# Patient Record
Sex: Female | Born: 1960 | Race: Black or African American | Hispanic: No | Smoking: Never smoker
Health system: Southern US, Community
[De-identification: ages and names within clinical notes are randomized; demographics above are authoritative.]

## PROBLEM LIST (undated history)

## (undated) DIAGNOSIS — R000 Tachycardia, unspecified: Secondary | ICD-10-CM

## (undated) HISTORY — DX: Tachycardia, unspecified: R00.0

---

## 2011-10-30 ENCOUNTER — Encounter (HOSPITAL_COMMUNITY): Payer: Self-pay | Admitting: *Deleted

## 2011-10-30 ENCOUNTER — Emergency Department (HOSPITAL_COMMUNITY)
Admission: EM | Admit: 2011-10-30 | Discharge: 2011-10-30 | Disposition: A | Discharge status: Home / Self Care | Payer: BC Managed Care – PPO | Source: Home / Self Care

## 2011-10-30 DIAGNOSIS — N390 Urinary tract infection, site not specified: Secondary | ICD-10-CM

## 2011-10-30 LAB — POCT URINALYSIS DIP (DEVICE)
Bilirubin Urine: NEGATIVE
Glucose, UA: NEGATIVE mg/dL
Nitrite: NEGATIVE

## 2011-10-30 MED ORDER — CIPROFLOXACIN HCL 500 MG PO TABS: 500.0000 mg | ORAL_TABLET | Freq: Two times a day (BID) | ORAL | Status: AC

## 2011-10-30 NOTE — ED Notes (Signed)
Pt  Has  Symptoms  Of  Frequency   Burning    And  Low  Back  Pain        X   1   Week    Reports  Some  Chills  As  Well        Pt  Is  Awake  As  Well as  Alert and  oreinted  Pleasant

## 2011-10-30 NOTE — ED Provider Notes (Signed)
History     CSN: [id number]  Arrival date & time 10/30/11  1440   First MD Initiated Contact with Patient 10/30/11 1459      Chief Complaint  Patient presents with  . Urinary Tract Infection    (Consider location/radiation/quality/duration/timing/severity/associated sxs/prior treatment) HPI  Patient is 51 year old female who presents to urgent care clinic with main concern of 3 days history of dysuria, urinary frequency and urgency, suprapubic area pain. In addition patient reports subjective fevers and chills, reports similar episode approximately one year ago when she was diagnosed with urinary tract infection. Patient reports having menstrual periods at this time and has history of bilateral tubal ligation 10 years ago. Patient denies other abdominal concerns, no blood in urine or stool, denies chest pain or shortness of breath. She describes dysuria and is uncomfortable, 7/10 in severity, no specific aggravating or alleviating factors and as mentioned already associated with subjective fevers and chills.  No past medical history on file.  No past surgical history on file.  No family history on file.  History  Substance Use Topics  . Smoking status: Not on file  . Smokeless tobacco: Not on file  . Alcohol Use: Not on file    OB History    No data available      Review of Systems  Review of Systems  Constitutional: Positive for fever, chills. Negative for diaphoresis, activity change, appetite change and fatigue.  HENT: Negative for ear pain, nosebleeds, congestion, facial swelling, rhinorrhea, neck pain, neck stiffness and ear discharge.   Eyes: Negative for pain, discharge, redness, itching and visual disturbance.  Respiratory: Negative for cough, choking, chest tightness, shortness of breath, wheezing and stridor.   Cardiovascular: Negative for chest pain, palpitations and leg swelling.  Gastrointestinal: Negative for abdominal distention.  Genitourinary: Positive  for dysuria, urgency, frequency, decreased urine volume. Musculoskeletal: Negative for back pain, joint swelling, arthralgias and gait problem.  Neurological: Negative for dizziness, tremors, seizures, syncope, facial asymmetry, speech difficulty, weakness, light-headedness, numbness and headaches.  Hematological: Negative for adenopathy. Does not bruise/bleed easily.  Psychiatric/Behavioral: Negative for hallucinations, behavioral problems, confusion, dysphoric mood, decreased concentration and agitation.    Allergies  Review of patient's allergies indicates not on file.  Home Medications  No current outpatient prescriptions on file.  BP 153/79  Pulse 80  Temp 98.7 F (37.1 C) (Oral)  Resp 16  SpO2 98%  Physical Exam  Physical Exam  Constitutional: Appears well-developed and well-nourished. No distress.  Cardiovascular: Normal rate, regular rhythm, normal heart sounds and intact distal pulses.  Exam reveals no gallop and no friction rub. No murmur heard. Pulmonary/Chest: Effort normal and breath sounds normal. No stridor. No respiratory distress. No wheezes, no rales.  Abdominal: Soft. Bowel sounds are normal. No distension and no mass, no tenderness. No rebound and no guarding.  Lymphadenopathy: No lymphadenopathy noted, cervical, inguinal. Skin: Skin is warm and dry. No rash noted. Not diaphoretic. No erythema. No pallor.   ED Course  Procedures (including critical care time)  Labs Reviewed - No data to display No results found.   Urinary tract infection - Patient symptoms consistent with urinary tract infection - I have emphasized importance of hygiene as well as a drinking plenty of fluids, discuss cranberry juice - I have also prescribed course of antibiotic for 5 days - Patient encouraged to see primary care physician if symptoms get worse - Urinalysis significant for small leukocytes   MDM  Urinary tract infection - will need course of  antibiotic        Dorothea Ogle, MD 10/30/11 1700

## 2011-10-30 NOTE — Discharge Instructions (Signed)

## 2012-08-01 ENCOUNTER — Emergency Department (HOSPITAL_COMMUNITY)
Admission: EM | Admit: 2012-08-01 | Discharge: 2012-08-01 | Disposition: A | Discharge status: Home / Self Care | Payer: BC Managed Care – PPO | Source: Home / Self Care | Attending: Emergency Medicine | Admitting: Emergency Medicine

## 2012-08-01 ENCOUNTER — Encounter (HOSPITAL_COMMUNITY): Payer: Self-pay | Admitting: *Deleted

## 2012-08-01 DIAGNOSIS — W57XXXA Bitten or stung by nonvenomous insect and other nonvenomous arthropods, initial encounter: Secondary | ICD-10-CM

## 2012-08-01 DIAGNOSIS — T148 Other injury of unspecified body region: Secondary | ICD-10-CM

## 2012-08-01 MED ORDER — TRIAMCINOLONE ACETONIDE 0.1 % EX CREA: TOPICAL_CREAM | Freq: Two times a day (BID) | CUTANEOUS | Status: AC

## 2012-08-01 MED ORDER — HYDROXYZINE HCL 25 MG PO TABS: 25.0000 mg | ORAL_TABLET | Freq: Four times a day (QID) | ORAL | Status: DC

## 2012-08-01 NOTE — ED Notes (Signed)
Pt reports attending conference last week and bug bugs were in the hotel that she stayed at - red, raised, itching areas to right arm, neck and back since last wed.

## 2012-08-01 NOTE — ED Provider Notes (Signed)
isHistory     CSN: [id number]  Arrival date & time 08/01/12  1125   First MD Initiated Contact with Patient 08/01/12 1214      Chief Complaint  Patient presents with  . Rash    (Consider location/radiation/quality/duration/timing/severity/associated sxs/prior treatment) HPI Comments: Patient presents care to this complaining of ongoing itchiness and a rash mainly concentrated on her right upper extremity, right neck and upper back, with moderate itchiness this started last week when she was attending a conference in a hotel in [place] where she stayed at. Patient developed a red raised and itchy rash since last Wednesday. Patient has not had any difficulty swallowing breathing or facial swelling. No constitutional symptoms such as fevers, generalized malaise arthralgias myalgias, nausea or vomiting. Patient took pictures of the actual " bedbugs", and informed the hotel administration at that point.  Patient is a 52 y.o. female presenting with rash. The history is provided by the patient.  Rash Location:  Torso and shoulder/arm Shoulder/arm rash location:  R arm Quality: itchiness, redness and swelling   Quality: not blistering, not bruising, not draining and not scaling   Onset quality:  Sudden Timing:  Constant Progression:  Worsening Context: not animal contact   Relieved by:  Nothing Ineffective treatments:  None tried Associated symptoms: induration   Associated symptoms: no fatigue, no fever, no headaches, no joint pain, no periorbital edema, no throat swelling and not vomiting     History reviewed. No pertinent past medical history.  History reviewed. No pertinent past surgical history.  Family History  Problem Relation Age of Onset  . Family history unknown: Yes    History  Substance Use Topics  . Smoking status: Never Smoker   . Smokeless tobacco: Not on file  . Alcohol Use: No    OB History   Grav Para Term Preterm Abortions TAB SAB Ect Mult Living                    Review of Systems  Constitutional: Negative for fever, chills, diaphoresis, activity change, appetite change and fatigue.  HENT: Negative for trouble swallowing.   Gastrointestinal: Negative for vomiting.  Musculoskeletal: Negative for arthralgias.  Skin: Positive for color change and rash. Negative for pallor and wound.  Neurological: Negative for headaches.    Allergies  Sodium benzoate and Sulfa antibiotics  Home Medications   Current Outpatient Rx  Name  Route  Sig  Dispense  Refill  . hydrOXYzine (ATARAX/VISTARIL) 25 MG tablet   Oral   Take 1 tablet (25 mg total) by mouth every 6 (six) hours.   12 tablet   0   . triamcinolone cream (KENALOG) 0.1 %   Topical   Apply topically 2 (two) times daily. APPLY TO AFFECTED AREAS   30 g   0     BP 141/52  Pulse 74  Temp(Src) 97.9 F (36.6 C) (Oral)  Resp 16  SpO2 100%  LMP 07/04/2012  Physical Exam  Nursing note and vitals reviewed. Constitutional: She is oriented to person, place, and time. She appears well-developed and well-nourished.  Non-toxic appearance. She does not have a sickly appearance. She does not appear ill. No distress.  HENT:  Head: Normocephalic.  Neurological: She is alert and oriented to person, place, and time.  Skin: Skin is warm. Rash noted. No abrasion, no bruising, no burn, no ecchymosis, no laceration, no lesion and no petechiae noted. Rash is papular and maculopapular. There is erythema.  ED Course  Procedures (including critical care time)  Labs Reviewed - No data to display No results found.   1. Bed bug bite      Patient verbally consented for patient to be taken for medical documentation purposes. MDM   Symptoms and skin exam consistent with bed bug bites. Prescribe patient may be able to see local steroid as well as prescribe hydroxyzine for pruritus-      Jimmie Molly, MD 08/01/12 1542

## 2014-01-26 ENCOUNTER — Emergency Department (HOSPITAL_COMMUNITY)
Admission: EM | Admit: 2014-01-26 | Discharge: 2014-01-26 | Disposition: A | Discharge status: Home / Self Care | Payer: BC Managed Care – PPO | Attending: Emergency Medicine | Admitting: Emergency Medicine

## 2014-01-26 ENCOUNTER — Emergency Department (HOSPITAL_COMMUNITY): Payer: BC Managed Care – PPO

## 2014-01-26 ENCOUNTER — Encounter (HOSPITAL_COMMUNITY): Payer: Self-pay | Admitting: Emergency Medicine

## 2014-01-26 DIAGNOSIS — R079 Chest pain, unspecified: Secondary | ICD-10-CM | POA: Diagnosis not present

## 2014-01-26 DIAGNOSIS — R0789 Other chest pain: Secondary | ICD-10-CM

## 2014-01-26 LAB — I-STAT TROPONIN, ED
TROPONIN I, POC: 0 ng/mL (ref 0.00–0.08)
Troponin i, poc: 0 ng/mL (ref 0.00–0.08)

## 2014-01-26 LAB — CBC
HCT: 42.2 % (ref 36.0–46.0)
Hemoglobin: 13.9 g/dL (ref 12.0–15.0)
MCH: 28.7 pg (ref 26.0–34.0)
MCHC: 32.9 g/dL (ref 30.0–36.0)
MCV: 87 fL (ref 78.0–100.0)
Platelets: 277 10*3/uL (ref 150–400)
RBC: 4.85 MIL/uL (ref 3.87–5.11)
RDW: 13.3 % (ref 11.5–15.5)
WBC: 7.4 10*3/uL (ref 4.0–10.5)

## 2014-01-26 LAB — BASIC METABOLIC PANEL
Anion gap: 10 (ref 5–15)
BUN: 10 mg/dL (ref 6–23)
CO2: 25 mEq/L (ref 19–32)
Calcium: 9.5 mg/dL (ref 8.4–10.5)
Chloride: 103 mEq/L (ref 96–112)
Creatinine, Ser: 0.69 mg/dL (ref 0.50–1.10)
GFR calc Af Amer: >90 mL/min (ref >90)
GFR calc non Af Amer: >90 mL/min (ref >90)
Glucose, Bld: 100 mg/dL — ABNORMAL HIGH (ref 70–99)
Potassium: 4.3 mEq/L (ref 3.7–5.3)
Sodium: 138 mEq/L (ref 137–147)

## 2014-01-26 MED ORDER — ASPIRIN 81 MG PO CHEW
324.0000 mg | CHEWABLE_TABLET | Freq: Once | ORAL | Status: AC
  Administered 2014-01-26: 324 mg via ORAL
  Filled 2014-01-26: qty 4

## 2014-01-26 MED ORDER — NITROGLYCERIN 0.4 MG SL SUBL
0.4000 mg | SUBLINGUAL_TABLET | SUBLINGUAL | Status: DC | PRN
  Administered 2014-01-26 (×2): 0.4 mg via SUBLINGUAL
  Filled 2014-01-26 (×2): qty 1

## 2014-01-26 NOTE — ED Provider Notes (Signed)
CSN: [id number]     Arrival date & time 01/26/14  1117 History   First MD Initiated Contact with Patient 01/26/14 1617     Chief Complaint  Patient presents with  . Chest Pain     (Consider location/radiation/quality/duration/timing/severity/associated sxs/prior Treatment) HPI Comments: Patient is an otherwise healthy 53 yo F presenting to the ED for one week of intermittent episodes of central chest pressure with no other associated symptoms. Patient states she has had constant CP since 9AM this morning. No alleviating or aggravating factors. Denies any history of similar chest pain. Denies any shortness of breath, cough, unilateral leg swelling, fever, chills, abdominal pain. Denies any recent surgeries, travel, immobilization. No history of PE or DVT in the past. No early familial cardiac history. No history of echocardiogram, stress test or cardiac catheterization  Patient is a 53 y.o. female presenting with chest pain.  Chest Pain   History reviewed. No pertinent past medical history. History reviewed. No pertinent past surgical history. No family history on file. History  Substance Use Topics  . Smoking status: Never Smoker   . Smokeless tobacco: Not on file  . Alcohol Use: No   OB History   Grav Para Term Preterm Abortions TAB SAB Ect Mult Living                 Review of Systems  Cardiovascular: Positive for chest pain.  All other systems reviewed and are negative.     Allergies  Sodium benzoate and Sulfa antibiotics  Home Medications   Prior to Admission medications   Not on File   BP 115/65  Pulse 71  Temp(Src) 98.3 F (36.8 C) (Oral)  Resp 20  SpO2 99%  LMP 01/12/2014 Physical Exam  Nursing note and vitals reviewed. Constitutional: She is oriented to person, place, and time. She appears well-developed and well-nourished. No distress.  HENT:  Head: Normocephalic and atraumatic.  Right Ear: External ear normal.  Left Ear: External ear normal.  Nose:  Nose normal.  Mouth/Throat: Oropharynx is clear and moist.  Eyes: Conjunctivae are normal.  Neck: Normal range of motion. Neck supple.  Cardiovascular: Normal rate, regular rhythm and normal heart sounds.   Pulmonary/Chest: Effort normal and breath sounds normal. No respiratory distress. She exhibits no tenderness.  Abdominal: Soft. Bowel sounds are normal. There is no tenderness.  Musculoskeletal: Normal range of motion. She exhibits no edema.  Neurological: She is alert and oriented to person, place, and time.  Skin: Skin is warm and dry. She is not diaphoretic.  Psychiatric: She has a normal mood and affect.    ED Course  Procedures (including critical care time) Medications  nitroGLYCERIN (NITROSTAT) SL tablet 0.4 mg (0.4 mg Sublingual Given 01/26/14 1716)  aspirin chewable tablet 324 mg (324 mg Oral Given 01/26/14 1703)    Labs Review Labs Reviewed  BASIC METABOLIC PANEL - Abnormal; Notable for the following:    Glucose, Bld 100 (*)    All other components within normal limits  CBC  I-STAT TROPOININ, ED  Rosezena Sensor, ED    Imaging Review Dg Chest 2 View  01/26/2014   CLINICAL DATA:  Mid chest pressure today.  EXAM: CHEST  2 VIEW  COMPARISON:  None.  FINDINGS: The heart size is at the upper limits of normal. This may be exacerbated by a relatively shallow inspiratory effort. The lungs are clear. There is no pleural effusion or pneumothorax. The osseous structures appear normal. Telemetry leads overlie the chest.  IMPRESSION: Borderline heart  size.  No acute cardiopulmonary process.   Electronically Signed   By: Roxy Horseman M.D.   On: 01/26/2014 16:45     EKG Interpretation None      5:46 PM Patient pain free.  Heart Score is 2.  MDM   Final diagnoses:  Chest pain of unknown etiology    Filed Vitals:   01/26/14 1815  BP: 115/65  Pulse: 71  Temp:   Resp: 20   Afebrile, NAD, non-toxic appearing, AAOx4.   Patient is to be discharged with recommendation  to follow up with PCP in regards to today's hospital visit. Chest pain is not likely of cardiac or pulmonary etiology d/t presentation, low clinical suspicion for PE, VSS, no tracheal deviation, no JVD or new murmur, RRR, breath sounds equal bilaterally, EKG without acute abnormalities, negative delta troponin, and negative CXR. Heart score is 2. Pt has been advised to follow up with cardiology as an outpatient and return to the ED is CP becomes exertional, associated with diaphoresis or nausea, radiates to left jaw/arm, worsens or becomes concerning in any way. Pt appears reliable for follow up and is agreeable to discharge.   Case has been discussed with Dr. Juleen China who agrees with the above plan to discharge.      Jeannetta Ellis, PA-C 01/26/14 2202

## 2014-01-26 NOTE — Discharge Instructions (Signed)
Please follow up with your primary care physician in 1-2 days. If you do not have one please call the [facility] number listed above. Please follow up with a cardiology group to schedule a follow up appointment.  Please read all discharge instructions and return precautions.    Chest Pain (Nonspecific) It is often hard to give a specific diagnosis for the cause of chest pain. There is always a chance that your pain could be related to something serious, such as a heart attack or a blood clot in the lungs. You need to follow up with your health care provider for further evaluation. CAUSES   Heartburn.  Pneumonia or bronchitis.  Anxiety or stress.  Inflammation around your heart (pericarditis) or lung (pleuritis or pleurisy).  A blood clot in the lung.  A collapsed lung (pneumothorax). It can develop suddenly on its own (spontaneous pneumothorax) or from trauma to the chest.  Shingles infection (herpes zoster virus). The chest wall is composed of bones, muscles, and cartilage. Any of these can be the source of the pain.  The bones can be bruised by injury.  The muscles or cartilage can be strained by coughing or overwork.  The cartilage can be affected by inflammation and become sore (costochondritis). DIAGNOSIS  Lab tests or other studies may be needed to find the cause of your pain. Your health care provider may have you take a test called an ambulatory electrocardiogram (ECG). An ECG records your heartbeat patterns over a 24-hour period. You may also have other tests, such as:  Transthoracic echocardiogram (TTE). During echocardiography, sound waves are used to evaluate how blood flows through your heart.  Transesophageal echocardiogram (TEE).  Cardiac monitoring. This allows your health care provider to monitor your heart rate and rhythm in real time.  Holter monitor. This is a portable device that records your heartbeat and can help diagnose heart  arrhythmias. It allows your health care provider to track your heart activity for several days, if needed.  Stress tests by exercise or by giving medicine that makes the heart beat faster. TREATMENT   Treatment depends on what may be causing your chest pain. Treatment may include:  Acid blockers for heartburn.  Anti-inflammatory medicine.  Pain medicine for inflammatory conditions.  Antibiotics if an infection is present.  You may be advised to change lifestyle habits. This includes stopping smoking and avoiding alcohol, caffeine, and chocolate.  You may be advised to keep your head raised (elevated) when sleeping. This reduces the chance of acid going backward from your stomach into your esophagus. Most of the time, nonspecific chest pain will improve within 2-3 days with rest and mild pain medicine.  HOME CARE INSTRUCTIONS   If antibiotics were prescribed, take them as directed. Finish them even if you start to feel better.  For the next few days, avoid physical activities that bring on chest pain. Continue physical activities as directed.  Do not use any tobacco products, including cigarettes, chewing tobacco, or electronic cigarettes.  Avoid drinking alcohol.  Only take medicine as directed by your health care provider.  Follow your health care provider's suggestions for further testing if your chest pain does not go away.  Keep any follow-up appointments you made. If you do not go to an appointment, you could develop lasting (chronic) problems with pain. If there is any problem keeping an appointment, call to reschedule. SEEK MEDICAL CARE IF:   Your chest pain does not go away, even after treatment.  You have a rash with blisters on your chest. °· You have a fever. °SEEK IMMEDIATE MEDICAL CARE IF:  °· You have increased chest pain or pain that spreads to your arm, neck, jaw, back, or abdomen. °· You have shortness of breath. °· You have an increasing cough, or you cough up  blood. °· You have severe back or abdominal pain. °· You feel nauseous or vomit. °· You have severe weakness. °· You faint. °· You have chills. °This is an emergency. Do not wait to see if the pain will go away. Get medical help at once. Call your local emergency services (911 in [place] Do not drive yourself to the hospital. °MAKE SURE YOU:  °· Understand these instructions. °· Will watch your condition. °· Will get help right away if you are not doing well or get worse. °Document Released: 02/01/2005 Document Revised: 04/29/2013 Document Reviewed: 11/28/2007 °[employer]® Patient Information ©2015 [employer]. This information is not intended to replace advice given to you by your health care provider. Make sure you discuss any questions you have with your health care provider. ° ° °

## 2014-01-26 NOTE — ED Notes (Signed)
Pt c/o centralized chest pain x 1 week, denies SOB, n/v.

## 2014-01-26 NOTE — ED Provider Notes (Signed)
Medical screening examination/treatment/procedure(s) were performed by non-physician practitioner and as supervising physician I was immediately available for consultation/collaboration.   EKG Interpretation None       Shanika Levings, MD 01/26/14 2353 

## 2014-02-09 ENCOUNTER — Emergency Department (HOSPITAL_COMMUNITY): Payer: BC Managed Care – PPO

## 2014-02-09 ENCOUNTER — Encounter: Payer: Self-pay | Admitting: Internal Medicine

## 2014-02-09 ENCOUNTER — Encounter (HOSPITAL_COMMUNITY): Payer: Self-pay | Admitting: Emergency Medicine

## 2014-02-09 ENCOUNTER — Inpatient Hospital Stay (HOSPITAL_COMMUNITY)
Admission: EM | Admit: 2014-02-09 | Discharge: 2014-02-18 | DRG: 271 | Disposition: A | Discharge status: Home / Self Care | Payer: BC Managed Care – PPO | Attending: Cardiothoracic Surgery | Admitting: Cardiothoracic Surgery

## 2014-02-09 ENCOUNTER — Ambulatory Visit (INDEPENDENT_AMBULATORY_CARE_PROVIDER_SITE_OTHER): Payer: BC Managed Care – PPO | Admitting: Internal Medicine

## 2014-02-09 VITALS — BP 122/80 | HR 121 | Ht 63.0 in | Wt 156.2 lb

## 2014-02-09 DIAGNOSIS — Z6827 Body mass index (BMI) 27.0-27.9, adult: Secondary | ICD-10-CM | POA: Diagnosis not present

## 2014-02-09 DIAGNOSIS — R590 Localized enlarged lymph nodes: Secondary | ICD-10-CM

## 2014-02-09 DIAGNOSIS — Z4682 Encounter for fitting and adjustment of non-vascular catheter: Secondary | ICD-10-CM

## 2014-02-09 DIAGNOSIS — D869 Sarcoidosis, unspecified: Secondary | ICD-10-CM | POA: Diagnosis present

## 2014-02-09 DIAGNOSIS — Z9689 Presence of other specified functional implants: Secondary | ICD-10-CM

## 2014-02-09 DIAGNOSIS — I309 Acute pericarditis, unspecified: Secondary | ICD-10-CM | POA: Diagnosis present

## 2014-02-09 DIAGNOSIS — I313 Pericardial effusion (noninflammatory): Secondary | ICD-10-CM | POA: Diagnosis present

## 2014-02-09 DIAGNOSIS — J90 Pleural effusion, not elsewhere classified: Secondary | ICD-10-CM | POA: Diagnosis present

## 2014-02-09 DIAGNOSIS — I3139 Other pericardial effusion (noninflammatory): Secondary | ICD-10-CM | POA: Diagnosis present

## 2014-02-09 DIAGNOSIS — R000 Tachycardia, unspecified: Secondary | ICD-10-CM | POA: Diagnosis present

## 2014-02-09 DIAGNOSIS — D72829 Elevated white blood cell count, unspecified: Secondary | ICD-10-CM | POA: Diagnosis not present

## 2014-02-09 DIAGNOSIS — R9431 Abnormal electrocardiogram [ECG] [EKG]: Secondary | ICD-10-CM | POA: Diagnosis not present

## 2014-02-09 DIAGNOSIS — R599 Enlarged lymph nodes, unspecified: Secondary | ICD-10-CM | POA: Diagnosis not present

## 2014-02-09 DIAGNOSIS — D861 Sarcoidosis of lymph nodes: Secondary | ICD-10-CM | POA: Diagnosis present

## 2014-02-09 DIAGNOSIS — L03114 Cellulitis of left upper limb: Secondary | ICD-10-CM | POA: Diagnosis present

## 2014-02-09 DIAGNOSIS — R079 Chest pain, unspecified: Secondary | ICD-10-CM

## 2014-02-09 DIAGNOSIS — I300 Acute nonspecific idiopathic pericarditis: Secondary | ICD-10-CM

## 2014-02-09 DIAGNOSIS — I319 Disease of pericardium, unspecified: Secondary | ICD-10-CM | POA: Diagnosis present

## 2014-02-09 DIAGNOSIS — Z881 Allergy status to other antibiotic agents status: Secondary | ICD-10-CM

## 2014-02-09 DIAGNOSIS — J939 Pneumothorax, unspecified: Secondary | ICD-10-CM

## 2014-02-09 DIAGNOSIS — Z9889 Other specified postprocedural states: Secondary | ICD-10-CM

## 2014-02-09 DIAGNOSIS — I200 Unstable angina: Secondary | ICD-10-CM

## 2014-02-09 LAB — MRSA PCR SCREENING: MRSA by PCR: NEGATIVE

## 2014-02-09 LAB — CBC WITH DIFFERENTIAL/PLATELET
Basophils Absolute: 0.1 10*3/uL (ref 0.0–0.1)
Basophils Relative: 1 % (ref 0–1)
EOS ABS: 0 10*3/uL (ref 0.0–0.7)
Eosinophils Relative: 0 % (ref 0–5)
HEMATOCRIT: 38.5 % (ref 36.0–46.0)
HEMOGLOBIN: 12.7 g/dL (ref 12.0–15.0)
LYMPHS ABS: 1.3 10*3/uL (ref 0.7–4.0)
Lymphocytes Relative: 11 % — ABNORMAL LOW (ref 12–46)
MCH: 28.3 pg (ref 26.0–34.0)
MCHC: 33 g/dL (ref 30.0–36.0)
MCV: 85.9 fL (ref 78.0–100.0)
MONO ABS: 1.1 10*3/uL — AB (ref 0.1–1.0)
MONOS PCT: 9 % (ref 3–12)
NEUTROS ABS: 9.9 10*3/uL — AB (ref 1.7–7.7)
NEUTROS PCT: 79 % — AB (ref 43–77)
Platelets: 262 10*3/uL (ref 150–400)
RBC: 4.48 MIL/uL (ref 3.87–5.11)
RDW: 13.3 % (ref 11.5–15.5)
WBC: 12.3 10*3/uL — ABNORMAL HIGH (ref 4.0–10.5)

## 2014-02-09 LAB — I-STAT TROPONIN, ED: Troponin i, poc: 0 ng/mL (ref 0.00–0.08)

## 2014-02-09 LAB — BASIC METABOLIC PANEL
Anion gap: 14 (ref 5–15)
BUN: 9 mg/dL (ref 6–23)
CHLORIDE: 98 meq/L (ref 96–112)
CO2: 24 mEq/L (ref 19–32)
CREATININE: 0.81 mg/dL (ref 0.50–1.10)
Calcium: 9 mg/dL (ref 8.4–10.5)
GFR calc Af Amer: >90 mL/min (ref >90)
GFR calc non Af Amer: 81 mL/min — ABNORMAL LOW (ref >90)
GLUCOSE: 117 mg/dL — AB (ref 70–99)
POTASSIUM: 3.9 meq/L (ref 3.7–5.3)
Sodium: 136 mEq/L — ABNORMAL LOW (ref 137–147)

## 2014-02-09 LAB — D-DIMER, QUANTITATIVE: D-Dimer, Quant: 0.81 ug/mL-FEU — ABNORMAL HIGH (ref 0.00–0.48)

## 2014-02-09 MED ORDER — DIPHENHYDRAMINE HCL 50 MG/ML IJ SOLN
25.0000 mg | Freq: Once | INTRAMUSCULAR | Status: AC
  Administered 2014-02-09: 25 mg via INTRAVENOUS
  Filled 2014-02-09: qty 1

## 2014-02-09 MED ORDER — SODIUM CHLORIDE 0.9 % IV SOLN
INTRAVENOUS | Status: DC
Start: 2014-02-09 — End: 2014-02-11
  Administered 2014-02-09 – 2014-02-11 (×4): via INTRAVENOUS

## 2014-02-09 MED ORDER — COLCHICINE 0.6 MG PO TABS
0.6000 mg | ORAL_TABLET | Freq: Two times a day (BID) | ORAL | Status: DC
  Administered 2014-02-10 – 2014-02-17 (×16): 0.6 mg via ORAL
  Filled 2014-02-09 (×19): qty 1

## 2014-02-09 MED ORDER — METOCLOPRAMIDE HCL 5 MG/ML IJ SOLN
10.0000 mg | Freq: Once | INTRAMUSCULAR | Status: AC
  Administered 2014-02-09: 10 mg via INTRAVENOUS
  Filled 2014-02-09: qty 2

## 2014-02-09 MED ORDER — ONDANSETRON HCL 4 MG PO TABS: 4.0000 mg | ORAL_TABLET | Freq: Four times a day (QID) | ORAL | Status: DC | PRN

## 2014-02-09 MED ORDER — ONDANSETRON HCL 4 MG/2ML IJ SOLN
4.0000 mg | Freq: Four times a day (QID) | INTRAMUSCULAR | Status: DC | PRN
  Administered 2014-02-10 – 2014-02-11 (×3): 4 mg via INTRAVENOUS
  Filled 2014-02-09 (×3): qty 2

## 2014-02-09 MED ORDER — SODIUM CHLORIDE 0.9 % IV BOLUS (SEPSIS)
1000.0000 mL | Freq: Once | INTRAVENOUS | Status: AC
  Administered 2014-02-09: 1000 mL via INTRAVENOUS

## 2014-02-09 MED ORDER — HEPARIN SODIUM (PORCINE) 5000 UNIT/ML IJ SOLN
5000.0000 [IU] | Freq: Three times a day (TID) | INTRAMUSCULAR | Status: DC
Start: 2014-02-09 — End: 2014-02-12
  Administered 2014-02-10 – 2014-02-11 (×7): 5000 [IU] via SUBCUTANEOUS
  Filled 2014-02-09 (×12): qty 1

## 2014-02-09 MED ORDER — SODIUM CHLORIDE 0.9 % IV SOLN
INTRAVENOUS | Status: DC
Start: 2014-02-09 — End: 2014-02-10

## 2014-02-09 MED ORDER — ACETAMINOPHEN 650 MG RE SUPP: 650.0000 mg | Freq: Four times a day (QID) | RECTAL | Status: DC | PRN

## 2014-02-09 MED ORDER — ACETAMINOPHEN 325 MG PO TABS
650.0000 mg | ORAL_TABLET | Freq: Once | ORAL | Status: AC
  Administered 2014-02-09: 650 mg via ORAL
  Filled 2014-02-09: qty 2

## 2014-02-09 MED ORDER — IOHEXOL 350 MG/ML SOLN
100.0000 mL | Freq: Once | INTRAVENOUS | Status: AC | PRN
  Administered 2014-02-09: 100 mL via INTRAVENOUS

## 2014-02-09 MED ORDER — ACETAMINOPHEN 325 MG PO TABS
650.0000 mg | ORAL_TABLET | Freq: Four times a day (QID) | ORAL | Status: DC | PRN
Start: 2014-02-09 — End: 2014-02-12

## 2014-02-09 MED ORDER — MORPHINE SULFATE 2 MG/ML IJ SOLN
2.0000 mg | INTRAMUSCULAR | Status: DC | PRN
  Administered 2014-02-10: 2 mg via INTRAVENOUS
  Filled 2014-02-09: qty 1

## 2014-02-09 MED ORDER — SODIUM CHLORIDE 0.9 % IJ SOLN
3.0000 mL | Freq: Two times a day (BID) | INTRAMUSCULAR | Status: DC
  Administered 2014-02-10 – 2014-02-11 (×4): 3 mL via INTRAVENOUS

## 2014-02-09 MED ORDER — IBUPROFEN 600 MG PO TABS
600.0000 mg | ORAL_TABLET | Freq: Three times a day (TID) | ORAL | Status: DC
  Administered 2014-02-09 – 2014-02-12 (×8): 600 mg via ORAL
  Filled 2014-02-09 (×10): qty 1

## 2014-02-09 MED ORDER — DIPHENHYDRAMINE HCL 50 MG/ML IJ SOLN: 25.0000 mg | Freq: Four times a day (QID) | INTRAMUSCULAR | Status: DC | PRN

## 2014-02-09 NOTE — ED Provider Notes (Signed)
CSN: [id number]     Arrival date & time 02/09/14  1627 History   First MD Initiated Contact with Patient 02/09/14 1658     Chief Complaint  Patient presents with  . Chest Pain     (Consider location/radiation/quality/duration/timing/severity/associated sxs/prior Treatment) HPI  Irvin Lizama is a 53 y.o. female presenting with left sided chest pressure that radiates into left shoulder. Pain started yesterday morning, is persistent and unchanged. Worse with activity, deep breathing, lying down. Patient also with nausea, no emesis or diaphoresis. Patient had similar pain 2 weeks ago which resolved. Patient was seen by cardiology today with normal EKG and sent here for further workup due to her tachycardia. Patient without unilateral leg swelling, tenderness, no history DVT, PE. No hemoptysis, trauma, recent surgeries, immobilization. No history hypertension, dyslipidemia, diabetes, family history premature cardiac. Patient with headache, typical of her migraines, bilateral with photophobia. Onset yesterday. No visual changes, slurred speech or weakness. No fever, chills, cough.  History reviewed. No pertinent past medical history. History reviewed. No pertinent past surgical history. History reviewed. No pertinent family history. History  Substance Use Topics  . Smoking status: Never Smoker   . Smokeless tobacco: Not on file  . Alcohol Use: No   OB History   Grav Para Term Preterm Abortions TAB SAB Ect Mult Living                 Review of Systems  Constitutional: Negative for fever and chills.  HENT: Negative for congestion and rhinorrhea.   Eyes: Negative for visual disturbance.  Respiratory: Negative for cough and shortness of breath.   Cardiovascular: Positive for chest pain. Negative for palpitations.  Gastrointestinal: Positive for nausea. Negative for vomiting and diarrhea.  Genitourinary: Negative for dysuria and hematuria.  Musculoskeletal: Negative for back pain and  gait problem.  Skin: Negative for rash.  Neurological: Positive for headaches. Negative for weakness.      Allergies  Sulfa antibiotics  Home Medications   Prior to Admission medications   Medication Sig Start Date End Date Taking? Authorizing Provider  Ascorbic Acid (VITAMIN C PO) Take 1 tablet by mouth daily.    Yes Historical Provider, MD  B Complex Vitamins (VITAMIN B COMPLEX PO) Take 1 tablet by mouth daily.    Yes Historical Provider, MD   BP 132/77  Pulse 102  Temp(Src) 98.4 F (36.9 C) (Oral)  Resp 26  Ht 5\' 3"  (1.6 m)  Wt 155 lb 10.3 oz (70.6 kg)  BMI 27.58 kg/m2  SpO2 96%  LMP 02/07/2014 Physical Exam  Nursing note and vitals reviewed. Constitutional: She appears well-developed and well-nourished. No distress.  HENT:  Head: Normocephalic and atraumatic.  Mouth/Throat: Oropharynx is clear and moist.  Eyes: Conjunctivae and EOM are normal. Pupils are equal, round, and reactive to light. Right eye exhibits no discharge. Left eye exhibits no discharge.  Neck: Normal range of motion. Neck supple. No JVD present.  Cardiovascular: Normal rate and regular rhythm.   No leg swelling or tenderness. Negative Homan's sign.   Pulmonary/Chest: Effort normal and breath sounds normal. No respiratory distress. She has no wheezes.  Abdominal: Soft. Bowel sounds are normal. She exhibits no distension. There is no tenderness.  Neurological: She is alert. No cranial nerve deficit. Coordination normal.  Strength 5/5 in upper and lower extremities. Sensation intact. Normal gait.   Skin: Skin is warm and dry. She is not diaphoretic.    ED Course  Procedures (including critical care time) Labs Review Labs Reviewed  CBC WITH DIFFERENTIAL - Abnormal; Notable for the following:    WBC 12.3 (*)    Neutrophils Relative % 79 (*)    Neutro Abs 9.9 (*)    Lymphocytes Relative 11 (*)    Monocytes Absolute 1.1 (*)    All other components within normal limits  BASIC METABOLIC PANEL -  Abnormal; Notable for the following:    Sodium 136 (*)    Glucose, Bld 117 (*)    GFR calc non Af Amer 81 (*)    All other components within normal limits  D-DIMER, QUANTITATIVE - Abnormal; Notable for the following:    D-Dimer, Quant 0.81 (*)    All other components within normal limits  CBC - Abnormal; Notable for the following:    Hemoglobin 11.3 (*)    HCT 34.0 (*)    All other components within normal limits  BASIC METABOLIC PANEL - Abnormal; Notable for the following:    Glucose, Bld 125 (*)    Calcium 7.9 (*)    All other components within normal limits  SEDIMENTATION RATE - Abnormal; Notable for the following:    Sed Rate 50 (*)    All other components within normal limits  LIPID PANEL - Abnormal; Notable for the following:    HDL 37 (*)    All other components within normal limits  MRSA PCR SCREENING  TSH  HEMOGLOBIN A1C  C-REACTIVE PROTEIN  I-STAT TROPOININ, ED    Imaging Review Dg Chest 2 View  02/09/2014   CLINICAL DATA:  Initial in caliber for 3 day history of left-sided chest and shoulder pain with chest tightness and headache.  EXAM: CHEST  2 VIEW  COMPARISON:  01/26/2014  FINDINGS: Two view exam of the chest shows new retrocardiac airspace disease in the left lower lobe. Right lung is clear. No pulmonary edema or pleural effusion. The cardio pericardial silhouette is enlarged. Imaged bony structures of the thorax are intact.  IMPRESSION: New airspace disease at the left lung base, compatible with pneumonia. Followup imaging is recommended to ensure complete resolution.   Electronically Signed   By: Kennith Center M.D.   On: 02/09/2014 18:13   Ct Angio Chest Pe W/cm &/or Wo Cm  02/09/2014   CLINICAL DATA:  Initial encounter for shortness of breath and chest pain.  EXAM: CT ANGIOGRAPHY CHEST WITH CONTRAST  TECHNIQUE: Multidetector CT imaging of the chest was performed using the standard protocol during bolus administration of intravenous contrast. Multiplanar CT image  reconstructions and MIPs were obtained to evaluate the vascular anatomy.  CONTRAST:  OMNIPAQUE IOHEXOL 350 MG/ML SOLN  COMPARISON:  None.  FINDINGS: Mediastinum: The heart size appears normal. There is a moderate pericardial effusion identified. The trachea appears patent and is midline. Normal appearance of the esophagus. Prominent mediastinal and hilar lymph nodes are identified. Pre-vascular lymph node is enlarged measuring 1.3 cm, image 24/series 4. Sub- carinal lymph node measures 1.2 cm, image 34/series 4. No supraclavicular or axillary adenopathy. The main pulmonary artery appears normal. No lobar or segmental pulmonary artery filling defects identified to suggest a clinically significant pulmonary embolus.  Lungs/Pleura: There is a small left pleural effusion. Dependent changes in atelectasis is noted in the lung bases. Subpleural nodule in the periphery of the right lower lobe measures 4 mm, image 46/series 4.  Upper Abdomen: The visualized portions of the liver and spleen are unremarkable. The visualized portions of the adrenal glands are unremarkable.  Musculoskeletal: Review of the visualized osseous structures is remarkable  for mild multi level disc space narrowing and ventral endplate spurring.  Review of the MIP images confirms the above findings.  IMPRESSION: 1. No evidence for acute pulmonary embolus. 2. Moderate pericardial effusion. 3. Enlarged mediastinal lymph nodes are identified. This is a nonspecific finding but may be seen with metastatic adenopathy, lymphoproliferative disorder as well as granulomatous inflammation or infection. Careful clinical correlation advise. 4. Lower lobe dependent changes and atelectasis. 5. Nonspecific right lower lobe pulmonary nodule measures 4 mm. If the patient is at high risk for bronchogenic carcinoma, follow-up chest CT at 1 year is recommended. If the patient is at low risk, no follow-up is needed. This recommendation follows the consensus statement:  Guidelines for Management of Small Pulmonary Nodules Detected on CT Scans: A Statement from the Fleischner Society as published in Radiology 2005; 237:395-400.   Electronically Signed   By: Signa Kellaylor  Stroud M.D.   On: 02/09/2014 19:28     EKG Interpretation   Date/Time:  Monday February 09 2014 16:35:08 EDT Ventricular Rate:  125 PR Interval:  116 QRS Duration: 72 QT Interval:  278 QTC Calculation: 401 R Axis:   61 Text Interpretation:  Sinus tachycardia Otherwise normal ECG tachycardia  new since previous  Confirmed by YAO  MD, DAVID ([id number]) on 02/09/2014  4:39:06 PM     Meds given in ED:  Medications  0.9 %  sodium chloride infusion ( Intravenous Not Given 02/09/14 2130)  heparin injection 5,000 Units (5,000 Units Subcutaneous Given 02/10/14 0700)  sodium chloride 0.9 % injection 3 mL (not administered)  0.9 %  sodium chloride infusion ( Intravenous New Bag/Given 02/09/14 2200)  acetaminophen (TYLENOL) tablet 650 mg (not administered)    Or  acetaminophen (TYLENOL) suppository 650 mg (not administered)  ondansetron (ZOFRAN) tablet 4 mg ( Oral See Alternative 02/10/14 0658)    Or  ondansetron (ZOFRAN) injection 4 mg (4 mg Intravenous Given 02/10/14 0658)  morphine 2 MG/ML injection 2 mg (2 mg Intravenous Given 02/10/14 0658)  diphenhydrAMINE (BENADRYL) injection 25 mg (not administered)  colchicine tablet 0.6 mg (0.6 mg Oral Given 02/10/14 0000)  ibuprofen (ADVIL,MOTRIN) tablet 600 mg (600 mg Oral Given 02/09/14 2300)  sodium chloride 0.9 % bolus 1,000 mL (1,000 mLs Intravenous New Bag/Given 02/09/14 1828)  acetaminophen (TYLENOL) tablet 650 mg (650 mg Oral Given 02/09/14 1813)  metoCLOPramide (REGLAN) injection 10 mg (10 mg Intravenous Given 02/09/14 1814)  diphenhydrAMINE (BENADRYL) injection 25 mg (25 mg Intravenous Given 02/09/14 1813)  iohexol (OMNIPAQUE) 350 MG/ML injection 100 mL (100 mLs Intravenous Contrast Given 02/09/14 1856)    Current Discharge Medication List        MDM    Final diagnoses:  Pericardial effusion  Pleural effusion   Patient presents from cardiology outpatient office with left sided chest pressure. Patient with normal EKG in office and concerned with persistent tachycardia. In ED, patient in no acute distress with persistent tachycardia. VSS. Unremarkable exam. Basic labs without significant abnormalities. WBC 12.3. D-dimer elevated. Normal troponin and EKG without acute changes. CTA with pericardial effusion, 4 mm lung nodule, and large mediastinal lymph nodes. Bed side echo performed by Dr. Silverio LayYao with moderate pericardial effusion. The source of pericardial effusion uncertain. Recommend admission for further work up and treatment. Family medicine service consulted who agree to admit with cardiology consult.   Discussed all results and patient verbalizes understanding and agrees with plan.  This is a shared patient. This patient was discussed with the physician who saw and evaluated the patient and agrees  with the plan.      Louann Sjogren, PA-C 02/10/14 [phone]

## 2014-02-09 NOTE — Progress Notes (Signed)
Echocardiogram 2D Echocardiogram has been performed.  Dorothey BasemanReel, Ekam Besson M 02/09/2014, 11:39 PM

## 2014-02-09 NOTE — ED Notes (Signed)
Pt remains monitored by blood pressure, pulse ox, and 5 lead. pts family remains at bedside.  

## 2014-02-09 NOTE — ED Notes (Signed)
Resents with left chest pain pain radiation into left shoulder associated with nausea and dizziness began 2 weeks ago but went away, yesterday came back and was worse. [facility]. Pt is tachycardic at this time. pai is constant and made worse with exertion and lying down.

## 2014-02-09 NOTE — Progress Notes (Signed)
OFFICE NOTE  Chief Complaint:  Chest pain  Primary Care Physician: No PCP Per Patient  HPI:  Sharon Case is a 53 year old female with no significant past medical history. There is no significant family history of heart disease. She works as a travel Oncologist. Several weeks ago she had an acute onset of chest pain with associated shortness of breath and tachycardia. Symptoms were persistent and did not improve with exertion or were not relieved by rest. Eventually they prove a little bit but then came back more significantly. She then was seen in the emergency room at [facility]. Troponin was negative. No acute EKG changes were noted. Chest x-ray showed borderline cardiomegaly with no acute changes. She reports her symptoms are somewhat worse when laying down or changing positions. In addition there is some worse worsening with taking deep breaths. The pain is sharp in character. She also has some sharp left upper abdominal pain. She did have some relief with nitroglycerin during her recent ER visit.  PMHx:  History reviewed. No pertinent past medical history.  History reviewed. No pertinent past surgical history.  FAMHx:  No family history on file. Neither parent has any significant history of CAD.  SOCHx:   reports that she has never smoked. She does not have any smokeless tobacco history on file. She reports that she does not drink alcohol. Her drug history is not on file.  ALLERGIES:  Allergies  Allergen Reactions  . Sodium Benzoate [Nutritional Supplements]   . Sulfa Antibiotics Itching and Rash    ROS: A comprehensive review of systems was negative except for: Cardiovascular: positive for chest pain  HOME MEDS: Current Outpatient Prescriptions  Medication Sig Dispense Refill  . Ascorbic Acid (VITAMIN C PO) Take by mouth.      . B Complex Vitamins (VITAMIN B COMPLEX PO) Take by mouth.       No current facility-administered medications for this visit.      LABS/IMAGING: No results found for this or any previous visit (from the past 48 hour(s)). No results found.  VITALS: BP 122/80  Pulse 121  Ht 5\' 3"  (1.6 m)  Wt 156 lb 3.2 oz (70.852 kg)  BMI 27.68 kg/m2  LMP 01/12/2014  EXAM: General appearance: alert and no distress Neck: no carotid bruit and no JVD Lungs: clear to auscultation bilaterally Heart: regular tachycardia, no murmur Abdomen: mild LUQ TTP under the last rib Extremities: extremities normal, atraumatic, no cyanosis or edema Pulses: 2+ and symmetric Skin: Skin color, texture, turgor normal. No rashes or lesions Neurologic: Grossly normal Psych: Appears somewhat calm  EKG: Sinus tachycardia at 121, poor R-wave progression  ASSESSMENT: 1. Acute onset chest pain with a pleuritic component 2. Chest pain relieved by nitroglycerin 3. Abnormal EKG with tachycardia  PLAN: 1.   Mrs. Sharon Case had acute onset chest discomfort which is somewhat pleuritic although she does not appear dyspneic, actually she appears quite comfortable. She is tachycardic and her symptoms are worse when laying down or sitting up or moving. The pain is constant and she is describing 10 out of 10 chest pain in the office today. I do not note any acute ST elevations or T-wave changes however she does have poor R-wave progression anteriorly. I'm concerned about possible pulmonary embolus or pericarditis/pericardial effusion or acute coronary syndrome. I would recommend that she presented to the emergency room at [facility] for further workup. It may be beneficial for her to get a CT pulmonary angiogram to rule  out PE and evaluate for possible pericardial effusion. She is agreeable with my recommendation for emergency room evaluation based on the severity of her symptoms.  Chrystie NoseKenneth C. Chondra Boyde, MD, [facility]ACC Attending Cardiologist [facility]  Sharon Case C 02/09/2014, 3:58 PM

## 2014-02-09 NOTE — H&P (Signed)
Family Medicine Teaching [facility] Admission History and Physical Service Pager: [phone]  Patient name: Sharon Case Medical record number: [id number] Date of birth: Nov 12, 1960 Age: 53 y.o. Gender: female  Primary Care Provider: No PCP Per Patient Consultants: Cardiology Code Status: Full  Chief Complaint: Chest pain  Assessment and Plan: Sharon Case is a 53 y.o. female presenting with Chest pain and new found pericardial effusion, 4 mm lung nodule, and large mediastinal lymph nodes.  She denies any PMH and surgical Hx. SH ehas had 2 children aged 30 and 2. Considering the cionglomeration of her lung nodule, mediastinal lymph nodes, and pleural effusion our differential diagnsosi includes neoplasm, infectious process, or inflammatory process. Considering only a few non specific signs for infectious process (leukocytosis and chills) and  lack of other signs of inflammatory conditions I think malignant etiology should be considered strongly but carefully given this very healthy patient.   Chest pain - Pericardial effusion  - With at least moderate cardiac effusion we will admit her to the Step down unit for close observation and rapid treatment as needed.  - Hemodynamically stable for now - ED physician has discussed with Cardiology who will arrange for Stat TTE tonight, order placed. Appreciate recommendations - Troponin neg X 1, unlikely ischemic in nature given presentation, will monitor vitals and follow for TTE results and defer additional troponins to Cardiology - Unlikely infectious, will Tx for presumed CAP if she develops fever - only signs are WBC 12.1 and chills - risk stratify in the AM - A1C, TSH, Lipid panel - NPO given possibility of procedural need - Morphine, tylenol PRN for pain  Leukocytosis - Slightly elevated  - Possible etiology infectious, inflammatory, and neoplasia related as above - Will monitor for additional signs of infection and have low  threshold for treating - repeat CBC in Am  FEN/GI: IV NS at 125 mL/hr, PRN zofran Prophylaxis: sub q heparin  Disposition: Step down unit  History of Present Illness: Sharon Case is a 53 y.o. female presenting with 5 weeks of chest pain. She describes this as substernal chest pressure that came on suddenly 5 weeks ago. It continued on and improved slightly after an ED visit 2 weeks ago. Two days ago she had a recurrence if the pain and was seen at the cardiology clinic today. She states that the pain increases with lying down, sitting up, walking, and laughing. She denies any alleviating factors.   She also notes headache for the last 3-4 days. She describes it as BL temporal-frontal pain that has been relieved with the meds that she has gotten tonight (reglan, tylenol, benadryl). She denies vision changes, numbness/tingling, or weakness.    Review Of Systems: Per HPI, Otherwise 12 point review of systems was performed and was unremarkable.  Patient Active Problem List   Diagnosis Date Noted  . Unstable angina 02/09/2014  . Abnormal EKG 02/09/2014  . Pericardial effusion 02/09/2014   Past Medical History: History reviewed. No pertinent past medical history. Past Surgical History: History reviewed. No pertinent past surgical history. Social History: History  Substance Use Topics  . Smoking status: Never Smoker   . Smokeless tobacco: Not on file  . Alcohol Use: No   Additional social history: Please also refer to relevant sections of EMR.  Family History: History reviewed. No pertinent family history. Allergies and Medications: Allergies  Allergen Reactions  . Sulfa Antibiotics Itching and Rash   No current facility-administered medications on file prior to encounter.   Current Outpatient Prescriptions  on File Prior to Encounter  Medication Sig Dispense Refill  . Ascorbic Acid (VITAMIN C PO) Take 1 tablet by mouth daily.       . B Complex Vitamins (VITAMIN B COMPLEX  PO) Take 1 tablet by mouth daily.         Objective: BP 133/80  Pulse 110  Temp(Src) 98.2 F (36.8 C) (Oral)  Resp 30  Ht 5\' 3"  (1.6 m)  Wt 153 lb (69.4 kg)  BMI 27.11 kg/m2  SpO2 95%  LMP 02/07/2014 Exam: Gen: NAD, alert, cooperative with exam HEENT: NCAT, EOMI, MMM CV: RRR, good S1/S2, no murmur Resp: CTABL, no wheezes, non-labored Abd: SNTND, BS present, no guarding or organomegaly Ext: No edema, warm Neuro: Alert and conversational, Strength 5/5 on BL Upper and Lower extremities, normal speech   Labs and Imaging: CBC BMET   Recent Labs Lab 02/09/14 1701  WBC 12.3*  HGB 12.7  HCT 38.5  PLT 262    Recent Labs Lab 02/09/14 1701  NA 136*  K 3.9  CL 98  CO2 24  BUN 9  CREATININE 0.81  GLUCOSE 117*  CALCIUM 9.0     D dimer 0.81 istat Troponin 0.0  DG chest 02/09/2014 IMPRESSION:  New airspace disease at the left lung base, compatible with  pneumonia. Followup imaging is recommended to ensure complete  resolution.  CT angio 10/5 IMPRESSION:  1. No evidence for acute pulmonary embolus.  2. Moderate pericardial effusion.  3. Enlarged mediastinal lymph nodes are identified. This is a  nonspecific finding but may be seen with metastatic adenopathy,  lymphoproliferative disorder as well as granulomatous inflammation  or infection. Careful clinical correlation advise.  4. Lower lobe dependent changes and atelectasis.  5. Nonspecific right lower lobe pulmonary nodule measures 4 mm. If the patient is at high risk for bronchogenic carcinoma, follow-up chest CT at 1 year is recommended. If the patient is at low risk, no follow-up is needed. This recommendation follows the consensus statement: Guidelines for Management of Small Pulmonary Nodules  Detected on CT Scans: A Statement from the Fleischner Society as published in Radiology 2005; 237:395-400.  EKG with sinus tach and poor R wave progression    Elenora GammaSamuel L Raymound Katich, MD 02/09/2014, 8:51 PM PGY-3, [facility] FPTS Intern pager: [phone], text pages welcome

## 2014-02-09 NOTE — ED Notes (Signed)
Report attempted 

## 2014-02-09 NOTE — Progress Notes (Signed)
2D echo reviewed personally by me.  Normal LVF with mild TR and mild PR.  EF 60-65% with no RWMA's.  There is a moderate sized circumferential pericardial effusion that is free flowing with max diameter 2.2cm.  There is no change in MV inflow velocity with respirations.  There is mild RA inversion.  No evidence of diastolic RV collapse in the apical 4 chamber view or parasternal view.  In the epigastric view the RV appears for shortened and appear small and underfilled.

## 2014-02-09 NOTE — ED Notes (Signed)
Pt placed into gown and on monitor upon arrival to room. Pt monitored by blood pressure, pulse ox, and 5 lead.  

## 2014-02-09 NOTE — Patient Instructions (Signed)
[facility]

## 2014-02-09 NOTE — Consult Note (Signed)
Admit date: 02/09/2014 Referring Physician  Dr. Deirdre Priest Primary Physician  NONE Primary Cardiologist  Dr. Rennis Golden Reason for Consultation  Chest pain with pericardial effusion  HPI: Sharon Case is a 53 year old female with no significant past medical history. There is no significant family history of heart disease. She works as a travel Oncologist. Several weeks ago she had an acute onset of chest pain with associated shortness of breath and tachycardia. Symptoms were persistent and did not improve with exertion or were not relieved by rest. Eventually they improved a little bit but then came back more significantly. She then was seen in the emergency room at [facility]. Troponin was negative. No acute EKG changes were noted. Chest x-ray showed borderline cardiomegaly with no acute changes. She reports her symptoms are somewhat worse when laying down or changing positions. In addition there is some worse worsening with taking deep breaths. The pain is sharp in character. She also has some sharp left upper abdominal pain. She did have some relief with nitroglycerin during her recent ER visit. She was seen in the office today by Dr. Rennis Golden.  EKG did not show any acute ST changes.  He was concerned over possible acute PE or pericarditis and she was sent to the ER.  A Chest CT angio was done revealing  No evidence for acute pulmonary embolus but there was a moderate pericardial effusion. There were enlarged mediastinal lymph nodes. This is a nonspecific finding but may be seen with metastatic adenopathy, lymphoproliferative disorder as well as granulomatous inflammation or infection.  She was admitted to medicine and Cardiology is now consulted regarding moderate pericardial effusion.      PMH:  History reviewed. No pertinent past medical history.   PSH:  History reviewed. No pertinent past surgical history.  Allergies:  Sulfa antibiotics Prior to Admit Meds:   Prescriptions prior to  admission  Medication Sig Dispense Refill  . Ascorbic Acid (VITAMIN C PO) Take 1 tablet by mouth daily.       . B Complex Vitamins (VITAMIN B COMPLEX PO) Take 1 tablet by mouth daily.        Fam HX:   History reviewed. No pertinent family history. Social HX:    History   Social History  . Marital Status: Married    Spouse Name: N/A    Number of Children: N/A  . Years of Education: N/A   Occupational History  . Not on file.   Social History Main Topics  . Smoking status: Never Smoker   . Smokeless tobacco: Not on file  . Alcohol Use: No  . Drug Use: Not on file  . Sexual Activity: Not on file   Other Topics Concern  . Not on file   Social History Narrative  . No narrative on file     ROS:  All 11 ROS were addressed and are negative except what is stated in the HPI  Physical Exam: Blood pressure 123/64, pulse 104, temperature 98.4 F (36.9 C), temperature source Oral, resp. rate 31, height 5\' 3"  (1.6 m), weight 155 lb 10.3 oz (70.6 kg), last menstrual period 02/07/2014, SpO2 95.00%.    General: Well developed, well nourished, in no acute distress Head: Eyes PERRLA, No xanthomas.   Normal cephalic and atramatic  Lungs:   Clear bilaterally to auscultation and percussion. Heart:   HRRR S1 S2 Pulses are 2+ & equal.            No carotid bruit. No JVD.  No abdominal bruits. No femoral bruits. Abdomen: Bowel sounds are positive, abdomen soft and non-tender without masses  Extremities:   No clubbing, cyanosis or edema.  DP +1 Neuro: Alert and oriented X 3. Psych:  Good affect, responds appropriately    Labs:   Lab Results  Component Value Date   WBC 12.3* 02/09/2014   HGB 12.7 02/09/2014   HCT 38.5 02/09/2014   MCV 85.9 02/09/2014   PLT 262 02/09/2014    Recent Labs Lab 02/09/14 1701  NA 136*  K 3.9  CL 98  CO2 24  BUN 9  CREATININE 0.81  CALCIUM 9.0  GLUCOSE 117*   No results found for this basename: PTT   No results found for this basename: INR, PROTIME     No results found for this basename: CKTOTAL, CKMB, CKMBINDEX, TROPONINI     No results found for this basename: CHOL   No results found for this basename: HDL   No results found for this basename: LDLCALC   No results found for this basename: TRIG   No results found for this basename: CHOLHDL   No results found for this basename: LDLDIRECT      Radiology:  Dg Chest 2 View  02/09/2014   CLINICAL DATA:  Initial in caliber for 3 day history of left-sided chest and shoulder pain with chest tightness and headache.  EXAM: CHEST  2 VIEW  COMPARISON:  01/26/2014  FINDINGS: Two view exam of the chest shows new retrocardiac airspace disease in the left lower lobe. Right lung is clear. No pulmonary edema or pleural effusion. The cardio pericardial silhouette is enlarged. Imaged bony structures of the thorax are intact.  IMPRESSION: New airspace disease at the left lung base, compatible with pneumonia. Followup imaging is recommended to ensure complete resolution.   Electronically Signed   By: Kennith CenterEric  Mansell M.D.   On: 02/09/2014 18:13   Ct Angio Chest Pe W/cm &/or Wo Cm  02/09/2014   CLINICAL DATA:  Initial encounter for shortness of breath and chest pain.  EXAM: CT ANGIOGRAPHY CHEST WITH CONTRAST  TECHNIQUE: Multidetector CT imaging of the chest was performed using the standard protocol during bolus administration of intravenous contrast. Multiplanar CT image reconstructions and MIPs were obtained to evaluate the vascular anatomy.  CONTRAST:  100mL OMNIPAQUE IOHEXOL 350 MG/ML SOLN  COMPARISON:  None.  FINDINGS: Mediastinum: The heart size appears normal. There is a moderate pericardial effusion identified. The trachea appears patent and is midline. Normal appearance of the esophagus. Prominent mediastinal and hilar lymph nodes are identified. Pre-vascular lymph node is enlarged measuring 1.3 cm, image 24/series 4. Sub- carinal lymph node measures 1.2 cm, image 34/series 4. No supraclavicular or axillary  adenopathy. The main pulmonary artery appears normal. No lobar or segmental pulmonary artery filling defects identified to suggest a clinically significant pulmonary embolus.  Lungs/Pleura: There is a small left pleural effusion. Dependent changes in atelectasis is noted in the lung bases. Subpleural nodule in the periphery of the right lower lobe measures 4 mm, image 46/series 4.  Upper Abdomen: The visualized portions of the liver and spleen are unremarkable. The visualized portions of the adrenal glands are unremarkable.  Musculoskeletal: Review of the visualized osseous structures is remarkable for mild multi level disc space narrowing and ventral endplate spurring.  Review of the MIP images confirms the above findings.  IMPRESSION: 1. No evidence for acute pulmonary embolus. 2. Moderate pericardial effusion. 3. Enlarged mediastinal lymph nodes are identified. This is a nonspecific finding  but may be seen with metastatic adenopathy, lymphoproliferative disorder as well as granulomatous inflammation or infection. Careful clinical correlation advise. 4. Lower lobe dependent changes and atelectasis. 5. Nonspecific right lower lobe pulmonary nodule measures 4 mm. If the patient is at high risk for bronchogenic carcinoma, follow-up chest CT at 1 year is recommended. If the patient is at low risk, no follow-up is needed. This recommendation follows the consensus statement: Guidelines for Management of Small Pulmonary Nodules Detected on CT Scans: A Statement from the Fleischner Society as published in Radiology 2005; 237:395-400.   Electronically Signed   By: Signa Kell M.D.   On: 02/09/2014 19:28    EKG:  Sinus tachycardia with no ST changes.  There is evidence of PR interval depression in I and II  ASSESSMENT:  1.  Chest pain secondary to acute pericarditis with moderate pericardial effusion on chest CT.  Troponin is normal clinical scenario is not c/w acute coronary syndrome. 2.  Acute pericarditis with  moderate pericardial effusion by chest CT.  I am concerned that she is tachycardic which may signal tamponade.  Her BP is stable. ? Etiology - Diff DX includes infection (unlikely since no recent viral symptoms), malignancy (especially in light of CT findings of mediastinal lymphadenopathy consider- acute lymphoproliferative disease), inflammatory such as acute auto immune etiology. 3.  Diffuse mediastinal lymphadenopathy 4.  Leukocytosis - ? Infectious vs. Inflammatory syndrome.     PLAN:   1.  I will get a stat echo to rule out tamponade 2. Check CRP and sed rate 3. NPO after MN for possible pericardiocentesis in am 4.  Start Cochicine 0.6mg  BID 5.  Start Ibuprofen 600mg  TID 6.  Check TSH   Quintella Reichert, MD  02/09/2014  10:17 PM

## 2014-02-09 NOTE — Progress Notes (Signed)
[facility] from [facility] at 2030. Was given patient care when initial call came in. Melany GuernseyNakeiaRN

## 2014-02-10 DIAGNOSIS — R079 Chest pain, unspecified: Secondary | ICD-10-CM

## 2014-02-10 DIAGNOSIS — I309 Acute pericarditis, unspecified: Secondary | ICD-10-CM

## 2014-02-10 DIAGNOSIS — I313 Pericardial effusion (noninflammatory): Secondary | ICD-10-CM

## 2014-02-10 DIAGNOSIS — R590 Localized enlarged lymph nodes: Secondary | ICD-10-CM

## 2014-02-10 DIAGNOSIS — J90 Pleural effusion, not elsewhere classified: Secondary | ICD-10-CM

## 2014-02-10 DIAGNOSIS — R599 Enlarged lymph nodes, unspecified: Secondary | ICD-10-CM

## 2014-02-10 DIAGNOSIS — I319 Disease of pericardium, unspecified: Secondary | ICD-10-CM

## 2014-02-10 LAB — CBC
HEMATOCRIT: 34 % — AB (ref 36.0–46.0)
HEMOGLOBIN: 11.3 g/dL — AB (ref 12.0–15.0)
MCH: 28.6 pg (ref 26.0–34.0)
MCHC: 33.2 g/dL (ref 30.0–36.0)
MCV: 86.1 fL (ref 78.0–100.0)
PLATELETS: 235 10*3/uL (ref 150–400)
RBC: 3.95 MIL/uL (ref 3.87–5.11)
RDW: 13.4 % (ref 11.5–15.5)
WBC: 10 10*3/uL (ref 4.0–10.5)

## 2014-02-10 LAB — LIPID PANEL
Cholesterol: 114 mg/dL (ref 0–200)
HDL: 37 mg/dL — ABNORMAL LOW (ref >39)
LDL CALC: 69 mg/dL (ref 0–99)
TRIGLYCERIDES: 38 mg/dL (ref <150)
Total CHOL/HDL Ratio: 3.1 RATIO
VLDL: 8 mg/dL (ref 0–40)

## 2014-02-10 LAB — BASIC METABOLIC PANEL
ANION GAP: 11 (ref 5–15)
BUN: 9 mg/dL (ref 6–23)
CHLORIDE: 105 meq/L (ref 96–112)
CO2: 26 mEq/L (ref 19–32)
Calcium: 7.9 mg/dL — ABNORMAL LOW (ref 8.4–10.5)
Creatinine, Ser: 0.63 mg/dL (ref 0.50–1.10)
GFR calc Af Amer: >90 mL/min (ref >90)
GFR calc non Af Amer: >90 mL/min (ref >90)
Glucose, Bld: 125 mg/dL — ABNORMAL HIGH (ref 70–99)
Potassium: 3.8 mEq/L (ref 3.7–5.3)
Sodium: 142 mEq/L (ref 137–147)

## 2014-02-10 LAB — HEMOGLOBIN A1C
Hgb A1c MFr Bld: 5.8 % — ABNORMAL HIGH (ref <5.7)
Mean Plasma Glucose: 120 mg/dL — ABNORMAL HIGH (ref <117)

## 2014-02-10 LAB — TSH: TSH: 2.86 u[IU]/mL (ref 0.350–4.500)

## 2014-02-10 LAB — SEDIMENTATION RATE: Sed Rate: 50 mm/hr — ABNORMAL HIGH (ref 0–22)

## 2014-02-10 LAB — ANGIOTENSIN CONVERTING ENZYME: Angiotensin-Converting Enzyme: 14 U/L (ref 8–52)

## 2014-02-10 LAB — C-REACTIVE PROTEIN
CRP: 10.9 mg/dL — ABNORMAL HIGH (ref <0.60)
CRP: 17.2 mg/dL — ABNORMAL HIGH (ref <0.60)

## 2014-02-10 LAB — RHEUMATOID FACTOR: Rhuematoid fact SerPl-aCnc: 20 IU/mL — ABNORMAL HIGH (ref <=14)

## 2014-02-10 MED ORDER — PROMETHAZINE HCL 25 MG/ML IJ SOLN
12.5000 mg | Freq: Four times a day (QID) | INTRAMUSCULAR | Status: DC | PRN
  Administered 2014-02-10 – 2014-02-12 (×7): 12.5 mg via INTRAVENOUS
  Filled 2014-02-10 (×7): qty 1

## 2014-02-10 MED ORDER — MORPHINE SULFATE 2 MG/ML IJ SOLN
2.0000 mg | INTRAMUSCULAR | Status: DC | PRN
  Administered 2014-02-10 – 2014-02-12 (×10): 2 mg via INTRAVENOUS
  Filled 2014-02-10 (×10): qty 1

## 2014-02-10 NOTE — Consult Note (Signed)
Name: Sharon Case MRN: [id number] DOB: [birth year]    ADMISSION DATE:  02/09/2014 CONSULTATION DATE:  02/10/14  REFERRING MD :  Dr. Deirdre Priest   CHIEF COMPLAINT:  Chest pain, pulmonary nodule on CT  BRIEF PATIENT DESCRIPTION: 53 y/o F admitted with chest pain.  Found to have a pericardial effusion, pulmonary nodule & LAN on CT .  PCCM consulted for evaluation.   SIGNIFICANT EVENTS  10/5  Admit with 6 wks intermittent chest pain, CT + for pericardial effusion, pulmonary nodule & LAN  STUDIES:  10/5  CTA Chest >> neg for PE, moderate pericardial effusion, mediastinal LAN, lower atx, R 4mm nodule 10/5  ECHO >> EF 60-65%, no wma, mod free flowing pericardial effusion, no hemodynamic compromise  HISTORY OF PRESENT ILLNESS:  53 y/o F, never smoker, with no known PMH who presented to [facility] ER on 10/5 with a 6 wk hx of intermittent chest pain that worsened the 2 wks prior to admit.  She reports the pain radiated into left shoulder and was associated with nausea. The patient has had an unintentional weight loss of 10 lbs over the last 5 months.  She was seen in the ER on 9/21 with a clear cxr, heart the upper limits of normal in size, no acute pulmonary findings, normal vital signs and negative EKG.  She was discharged home.  Patient describes the pain as sharp, intermittent and worse with activity or lying down. She followed up with Cardiology on 10/6 with a repeat EKG that raised concerns for poor R-wave progression anteriorly.  Cardiology raised concerns for potential PE, pericarditis, pericardial effusion or ACS and was directed to the ER for further work up.      ER evaluation 10/5 notable for Hgb 12.7, D-Dimer 0.81, troponin 0.0, essentially normal cholesterol panel & BMP.  CXR noted the cardiac silhouette to be the upper limits of normal and concerns for new L lower airspace disease.   CTA of the chest was completed to rule out PE and was negative but did demonstrate a moderate  pericardial effusion, mediastinal LAN, lower atx, R 4mm nodule.  ECHO assessed and pericardial effusion was free flowing without tamponade physiology.  Patient was admitted for further evaluation.  PCCM consulted to evaluate 4mm pulmonary nodule, LAN and effusion.    At baseline, she is able to walk 30 minutes on the treadmill daily.  She works full time and denies recreational drug usage.  Not clear that she is up to date with cancer screenings.  She reports she does not have a primary provider and stated she had a mammogram on 10/5 (?? No record), has never had a colonoscopy.  She denies recent illness, no sick contacts or travel.   PAST MEDICAL HISTORY :   has no past medical history on file.  has no past surgical history on file.  HOME MEDICATIONS Prior to Admission medications   Medication Sig Start Date End Date Taking? Authorizing Provider  Ascorbic Acid (VITAMIN C PO) Take 1 tablet by mouth daily.    Yes Historical Provider, MD  B Complex Vitamins (VITAMIN B COMPLEX PO) Take 1 tablet by mouth daily.    Yes Historical Provider, MD   Allergies  Allergen Reactions  . Sulfa Antibiotics Itching and Rash    FAMILY HISTORY:  family history is not on file.  SOCIAL HISTORY:  reports that she has never smoked. She does not have any smokeless tobacco history on file. She reports that she does not drink alcohol.  REVIEW OF SYSTEMS:   Constitutional: Negative for fever, chills, malaise/fatigue and diaphoresis. + 10lb weight loss HENT: Negative for hearing loss, ear pain, nosebleeds, congestion, sore throat, neck pain, tinnitus and ear discharge.   Eyes: Negative for blurred vision, double vision, photophobia, pain, discharge and redness.  Respiratory: Negative for cough, hemoptysis, sputum production, shortness of breath, wheezing and stridor.   Cardiovascular: Negative for palpitations, orthopnea, claudication, leg swelling and PND.  + intermittent chest pain, worse when lying down, sharp  in nature that radiates to her back at times Gastrointestinal: Negative for heartburn, nausea, vomiting, abdominal pain, diarrhea, constipation, blood in stool and melena.  Genitourinary: Negative for dysuria, urgency, frequency, hematuria and flank pain.  Musculoskeletal: Negative for myalgias, back pain, joint pain and falls.  Skin: Negative for itching and rash.  Neurological: Negative for dizziness, tingling, tremors, sensory change, speech change, focal weakness, seizures, loss of consciousness, weakness and headaches.  Endo/Heme/Allergies: Negative for environmental allergies and polydipsia. Does not bruise/bleed easily.  SUBJECTIVE: Denies current complaints.    VITAL SIGNS: Temp:  [98.2 F (36.8 C)-98.4 F (36.9 C)] 98.4 F (36.9 C) (10/06 0420) Pulse Rate:  [97-125] 102 (10/06 0020) Resp:  [18-37] 26 (10/06 0020) BP: (113-138)/(64-84) 132/77 mmHg (10/06 0020) SpO2:  [94 %-97 %] 96 % (10/06 0000) Weight:  [153 lb (69.4 kg)-156 lb 3.2 oz (70.852 kg)] 155 lb 10.3 oz (70.6 kg) (10/06 0420)  PHYSICAL EXAMINATION: General:  wdwn adult female in NAD Neuro:  Poor eye contact, does not open eyes for most of interview.  MAE, speech clear.  HEENT:  Mm pink/moist, no jvd, no LAN Cardiovascular:  s1s2 tachy, no m/r/g Lungs:  resp's even/non-labored, lungs bilaterally essentially clear  Abdomen:  Round/soft, bsx4 active  Musculoskeletal:  No acute deformities  Skin:  No rashes or lesions, warm/dry   Recent Labs Lab 02/09/14 1701 02/10/14 0320  NA 136* 142  K 3.9 3.8  CL 98 105  CO2 24 26  BUN 9 9  CREATININE 0.81 0.63  GLUCOSE 117* 125*    Recent Labs Lab 02/09/14 1701 02/10/14 0320  HGB 12.7 11.3*  HCT 38.5 34.0*  WBC 12.3* 10.0  PLT 262 235   Dg Chest 2 View  02/09/2014   CLINICAL DATA:  Initial in caliber for 3 day history of left-sided chest and shoulder pain with chest tightness and headache.  EXAM: CHEST  2 VIEW  COMPARISON:  01/26/2014  FINDINGS: Two view exam  of the chest shows new retrocardiac airspace disease in the left lower lobe. Right lung is clear. No pulmonary edema or pleural effusion. The cardio pericardial silhouette is enlarged. Imaged bony structures of the thorax are intact.  IMPRESSION: New airspace disease at the left lung base, compatible with pneumonia. Followup imaging is recommended to ensure complete resolution.   Electronically Signed   By: Kennith Center M.D.   On: 02/09/2014 18:13   Ct Angio Chest Pe W/cm &/or Wo Cm  02/09/2014   CLINICAL DATA:  Initial encounter for shortness of breath and chest pain.  EXAM: CT ANGIOGRAPHY CHEST WITH CONTRAST  TECHNIQUE: Multidetector CT imaging of the chest was performed using the standard protocol during bolus administration of intravenous contrast. Multiplanar CT image reconstructions and MIPs were obtained to evaluate the vascular anatomy.  CONTRAST:  OMNIPAQUE IOHEXOL 350 MG/ML SOLN  COMPARISON:  None.  FINDINGS: Mediastinum: The heart size appears normal. There is a moderate pericardial effusion identified. The trachea appears patent and is midline. Normal appearance of  the esophagus. Prominent mediastinal and hilar lymph nodes are identified. Pre-vascular lymph node is enlarged measuring 1.3 cm, image 24/series 4. Sub- carinal lymph node measures 1.2 cm, image 34/series 4. No supraclavicular or axillary adenopathy. The main pulmonary artery appears normal. No lobar or segmental pulmonary artery filling defects identified to suggest a clinically significant pulmonary embolus.  Lungs/Pleura: There is a small left pleural effusion. Dependent changes in atelectasis is noted in the lung bases. Subpleural nodule in the periphery of the right lower lobe measures 4 mm, image 46/series 4.  Upper Abdomen: The visualized portions of the liver and spleen are unremarkable. The visualized portions of the adrenal glands are unremarkable.  Musculoskeletal: Review of the visualized osseous structures is remarkable  for mild multi level disc space narrowing and ventral endplate spurring.  Review of the MIP images confirms the above findings.  IMPRESSION: 1. No evidence for acute pulmonary embolus. 2. Moderate pericardial effusion. 3. Enlarged mediastinal lymph nodes are identified. This is a nonspecific finding but may be seen with metastatic adenopathy, lymphoproliferative disorder as well as granulomatous inflammation or infection. Careful clinical correlation advise. 4. Lower lobe dependent changes and atelectasis. 5. Nonspecific right lower lobe pulmonary nodule measures 4 mm. If the patient is at high risk for bronchogenic carcinoma, follow-up chest CT at 1 year is recommended. If the patient is at low risk, no follow-up is needed. This recommendation follows the consensus statement: Guidelines for Management of Small Pulmonary Nodules Detected on CT Scans: A Statement from the Fleischner Society as published in Radiology 2005; 237:395-400.   Electronically Signed   By: Signa Kellaylor  Stroud M.D.   On: 02/09/2014 19:28    ASSESSMENT / PLAN:  Pulmonary Nodule - 4mm in size, RLL on CTA Mediastinal LAN Pericardial Effusion   53 y/o AAF, never smoker, admitted with chest pain and found to have a pericardial effusion, small pulmonary nodule and mediastinal LAN.  DDx concerning for malignancy, auto-immune process, viral/infectious etiology.  High pre-test prob for sarcoid based on findings + age + african american ethnicity  Plan (per Dr Marchelle Gearingamaswamy - modified after d/w Dr Jomarie Longsroituru) - -Assess auto-immune panel (ANA, DS DNA, ANCA, AE) -? Assess pericardial fluid /pericardium for diagnosis v mediastinal node (EBUS v Med) first for diagnosis - Dr Jomarie Longsroituru will d/w surgeon - IF no intervention on pericardium then Dr Marchelle Gearingamaswamy will arrange for outpatient EBUS guided TBNA bx of mediastinal node - Will need serial CT 1 year for 4mm RLL Nodule   Canary BrimBrandi Ollis, NP-C [place] [facility] Pgr: [phone] or  [phone] 02/10/2014, 8:46 AM  STAFF NOTE  = -personally evaluted patient. She has chest pain hx with findings of AA female in her 4550s with pericarial effusion and mediastinal node. High suspicion of sarcoid. See my plan above. Rest per NP. I updated patient  Dr. Kalman ShanMurali Roselle Norton, M.D., [facility] Hospital[facility].C.C.P [facility]monary and Critical Care Medicine Staff Physician [facility] Pager: [phone], If no answer or between  15:00h - 7:00h: call [phone]  02/10/2014 11:29 AM

## 2014-02-10 NOTE — Progress Notes (Signed)
Patient Name: Sharon Case Date of Encounter: 02/10/2014     Active Problems:   Pericardial effusion   Leukocytosis   Primary Cardiologist Dr. Rennis Golden  SUBJECTIVE  Some chest discomfort with movement, no SOB or dizziness  CURRENT MEDS . colchicine  0.6 mg Oral BID  . heparin  5,000 Units Subcutaneous 3 times per day  . ibuprofen  600 mg Oral TID  . sodium chloride  3 mL Intravenous Q12H    OBJECTIVE  Filed Vitals:   02/10/14 0000 02/10/14 0020 02/10/14 0420 02/10/14 0800  BP: 132/77 132/77  131/76  Pulse: 98 102  110  Temp:  98.2 F (36.8 C) 98.4 F (36.9 C) 98.4 F (36.9 C)  TempSrc:  Oral Oral Oral  Resp: 32 26  24  Height:      Weight:   155 lb 10.3 oz (70.6 kg)   SpO2: 96%   91%    Intake/Output Summary (Last 24 hours) at 02/10/14 1048 Last data filed at 02/10/14 1003  Gross per 24 hour  Intake   1503 ml  Output      0 ml  Net   1503 ml   Filed Weights   02/09/14 1635 02/09/14 2057 02/10/14 0420  Weight: 153 lb (69.4 kg) 155 lb 10.3 oz (70.6 kg) 155 lb 10.3 oz (70.6 kg)    PHYSICAL EXAM  General: Pleasant, NAD. Neuro: Alert and oriented X 3. Moves all extremities spontaneously. Psych: Normal affect. HEENT:  Normal  Neck: Supple without bruits. Mild JVD. Lungs:  Resp regular and unlabored, decrease breath sound in LLL Heart: RRR no s3, s4, or murmurs. No pericardial rubs on exam Abdomen: Soft, non-tender, non-distended, BS + x 4.  Extremities: No clubbing, cyanosis or edema. DP/PT/Radials 2+ and equal bilaterally.  Accessory Clinical Findings  CBC  Recent Labs  02/09/14 1701 02/10/14 0320  WBC 12.3* 10.0  NEUTROABS 9.9*  --   HGB 12.7 11.3*  HCT 38.5 34.0*  MCV 85.9 86.1  PLT 262 235   Basic Metabolic Panel  Recent Labs  02/09/14 1701 02/10/14 0320  NA 136* 142  K 3.9 3.8  CL 98 105  CO2 24 26  GLUCOSE 117* 125*  BUN 9 9  CREATININE 0.81 0.63  CALCIUM 9.0 7.9*   D-Dimer  Recent Labs  02/09/14 1726  DDIMER  0.81*   Fasting Lipid Panel  Recent Labs  02/10/14 0320  CHOL 114  HDL 37*  LDLCALC 69  TRIG 38  CHOLHDL 3.1   Thyroid Function Tests  Recent Labs  02/10/14 0320  TSH 2.860    TELE NSR with HR 90-110s, transient decrease down to 50s this am, quickly recovered    ECG  Sinus tachycardia with HR 120s, no significant ST-T wave changes  Echocardiogram  LV EF: 60% - 65%  ------------------------------------------------------------------- Indications: Pericardial effusion 423.9.  ------------------------------------------------------------------- Study Conclusions  - Left ventricle: The cavity size was normal. Systolic function was normal. The estimated ejection fraction was in the range of 60% to 65%. Wall motion was normal; there were no regional wall motion abnormalities. - Right ventricle: The cavity size was mildly decreased. Wall thickness was normal. - Pericardium, extracardiac: The epigastric view shows a small RV cavity that appears for shortened. No evidence of RV collapse in the apical 4 chamber or parasternal views. A moderate, free-flowing pericardial effusion was identified circumferential to the heart. The fluid had no internal echoes.There was no evidence of hemodynamic compromise. There was mildright atrial chamber collapse for  more than 50% of the cardiac cycle. Respirophasic change in stroke volume was normal. Features were not consistent with tamponade physiology.      Radiology/Studies  Dg Chest 2 View  02/09/2014   CLINICAL DATA:  Initial in caliber for 3 day history of left-sided chest and shoulder pain with chest tightness and headache.  EXAM: CHEST  2 VIEW  COMPARISON:  01/26/2014  FINDINGS: Two view exam of the chest shows new retrocardiac airspace disease in the left lower lobe. Right lung is clear. No pulmonary edema or pleural effusion. The cardio pericardial silhouette is enlarged. Imaged bony structures of the thorax are intact.   IMPRESSION: New airspace disease at the left lung base, compatible with pneumonia. Followup imaging is recommended to ensure complete resolution.   Electronically Signed   By: Kennith CenterEric  Mansell M.D.   On: 02/09/2014 18:13   Dg Chest 2 View  01/26/2014   CLINICAL DATA:  Mid chest pressure today.  EXAM: CHEST  2 VIEW  COMPARISON:  None.  FINDINGS: The heart size is at the upper limits of normal. This may be exacerbated by a relatively shallow inspiratory effort. The lungs are clear. There is no pleural effusion or pneumothorax. The osseous structures appear normal. Telemetry leads overlie the chest.  IMPRESSION: Borderline heart size.  No acute cardiopulmonary process.   Electronically Signed   By: Roxy HorsemanBill  Veazey M.D.   On: 01/26/2014 16:45   Ct Angio Chest Pe W/cm &/or Wo Cm  02/09/2014   CLINICAL DATA:  Initial encounter for shortness of breath and chest pain.  EXAM: CT ANGIOGRAPHY CHEST WITH CONTRAST  TECHNIQUE: Multidetector CT imaging of the chest was performed using the standard protocol during bolus administration of intravenous contrast. Multiplanar CT image reconstructions and MIPs were obtained to evaluate the vascular anatomy.  CONTRAST:  100mL OMNIPAQUE IOHEXOL 350 MG/ML SOLN  COMPARISON:  None.  FINDINGS: Mediastinum: The heart size appears normal. There is a moderate pericardial effusion identified. The trachea appears patent and is midline. Normal appearance of the esophagus. Prominent mediastinal and hilar lymph nodes are identified. Pre-vascular lymph node is enlarged measuring 1.3 cm, image 24/series 4. Sub- carinal lymph node measures 1.2 cm, image 34/series 4. No supraclavicular or axillary adenopathy. The main pulmonary artery appears normal. No lobar or segmental pulmonary artery filling defects identified to suggest a clinically significant pulmonary embolus.  Lungs/Pleura: There is a small left pleural effusion. Dependent changes in atelectasis is noted in the lung bases. Subpleural nodule in  the periphery of the right lower lobe measures 4 mm, image 46/series 4.  Upper Abdomen: The visualized portions of the liver and spleen are unremarkable. The visualized portions of the adrenal glands are unremarkable.  Musculoskeletal: Review of the visualized osseous structures is remarkable for mild multi level disc space narrowing and ventral endplate spurring.  Review of the MIP images confirms the above findings.  IMPRESSION: 1. No evidence for acute pulmonary embolus. 2. Moderate pericardial effusion. 3. Enlarged mediastinal lymph nodes are identified. This is a nonspecific finding but may be seen with metastatic adenopathy, lymphoproliferative disorder as well as granulomatous inflammation or infection. Careful clinical correlation advise. 4. Lower lobe dependent changes and atelectasis. 5. Nonspecific right lower lobe pulmonary nodule measures 4 mm. If the patient is at high risk for bronchogenic carcinoma, follow-up chest CT at 1 year is recommended. If the patient is at low risk, no follow-up is needed. This recommendation follows the consensus statement: Guidelines for Management of Small Pulmonary Nodules Detected on  CT Scans: A Statement from the Fleischner Society as published in Radiology 2005; 237:395-400.   Electronically Signed   By: Signa Kell M.D.   On: 02/09/2014 19:28    ASSESSMENT AND PLAN  1. Chest pain secondary to acute pericarditis with moderate pericardial effusion on CT  - echo 02/09/2014 EF 60-65%, No evidence of RV collapse in the apical 4 chamber or parasternal views, no evidence of hemodynamic compromise. There was mildright atrial chamber collapse for more than 50% of the cardiac cycle. Feature not consistent with tamponade  - continue cochicine and ibuprofen, normally would do medical management given lack of tamponade on Echo, however patient has been tachycardic with HR 90-110s all night. Discussed with Dr. Monica Becton who suspect for sarcoidosis which require biopsy  (either via bronchoscopy or pericardial biopsy if need pericardial window, unfortunately pericardiocentesis would not give the same result) - ?if patient need pericardial window with lack of temponade on echo - Dr. Marchelle Gearing plan to discuss with Dr. Keene Breath  2. Acute pericarditis with moderate pericardial effusion  - unclear etiology, diff dx include infection (unlikely given lack of recent symptom), malignancy, inflammatory dx  - sed rate 50, TSH normal  3. Diffuse mediastinal lymphadenopathy  4. Leukocytosis  Signed, Azalee Course PA-C Pager: [phone]  I have seen and examined the patient along with Azalee Course PA-C.  I have reviewed the chart, notes and new data.  I agree with PA/NP's note.  Acute inflammatory pericarditis with a sizeable effusion, but without tamponade. Prominent mediastinal lymphadenopathy suggests possible sarcoidosis.  PLAN: Need tissue diagnosis. Consult thoracic surgery for mediastinoscopy with lymph node biopsy versus pericardial window or both.  Thurmon Fair, MD, [facility] [phone] 02/10/2014, 1:33 PM

## 2014-02-10 NOTE — Consult Note (Signed)
[address]       [place] [address]             [phone]        Eden Toohey [facility] Medical Record #[id number] Date of Birth: 1961/02/28  Referring: No ref. provider found Primary Care: No PCP Per Patient  Chief Complaint:    Chief Complaint  Patient presents with  . Chest Pain   patient examined, CTA of the chest and echocardiogram reviewed  History of Present Illness:     I was asked to evaluate this 53 year old female presents with 2 week history of left anterior and left shoulder pain associated with a 10 pound weight loss over the past few months. She was found have a moderate size pericardial effusion without tamponade physiology with good LV function and no valvular disease. Cardiac enzymes and 12-lead EKG are negative. CTA of the chest demonstrated no evidence of aortic dissection or pulmonary emboli. There was abnormal adenopathy in the AP window as well as mild subcarinal adenopathy--a small left pleural effusion was also noted. The patient denies fever or night sweats or productive cough. She denies any thoracic trauma. She denies any bleeding disorders headache or neurologic changes. Patient has been admitted by family medicine with consultants including cardiology and pulmonology. The patient is felt to have pericarditis as well as possible sarcoidosis or lymphoma.  Current Activity/ Functional Status: Patient is been working full-time as an Airline pilot Patient denies any difficulty with ADLs   Zubrod Score: At the time of surgery this patient's most appropriate activity status/level should be described as: []     0    Normal activity, no symptoms [x]     1    Restricted in physical strenuous activity but ambulatory, able to do out light work []     2    Ambulatory and capable of self care, unable to do work activities, up and about                 more than 50%  Of the time                            []     3    Only limited self care,  in bed greater than 50% of waking hours []     4    Completely disabled, no self care, confined to bed or chair []     5    Moribund  History reviewed. No pertinent past medical history.  History reviewed. No pertinent past surgical history.  History  Smoking status  . Never Smoker   Smokeless tobacco  . Not on file    History  Alcohol Use No    History   Social History  . Marital Status: Married    Spouse Name: N/A    Number of Children: N/A  . Years of Education: N/A   Occupational History  . Not on file.   Social History Main Topics  . Smoking status: Never Smoker   . Smokeless tobacco: Not on file  . Alcohol Use: No  . Drug Use: Not on file  . Sexual Activity: Not on file   Other Topics Concern  . Not on file   Social History Narrative  . No narrative on file    Allergies  Allergen Reactions  . Sulfa Antibiotics Itching and Rash    Current Facility-Administered Medications  Medication Dose Route Frequency Provider  Last Rate Last Dose  . 0.9 %  sodium chloride infusion   Intravenous Continuous Elenora GammaSamuel L Bradshaw, MD 125 mL/hr at 02/10/14 0800    . acetaminophen (TYLENOL) tablet 650 mg  650 mg Oral Q6H PRN Elenora GammaSamuel L Bradshaw, MD       Or  . acetaminophen (TYLENOL) suppository 650 mg  650 mg Rectal Q6H PRN Elenora GammaSamuel L Bradshaw, MD      . colchicine tablet 0.6 mg  0.6 mg Oral BID Quintella Reichertraci R Turner, MD   0.6 mg at 02/10/14 0953  . diphenhydrAMINE (BENADRYL) injection 25 mg  25 mg Intravenous Q6H PRN Elenora GammaSamuel L Bradshaw, MD      . heparin injection 5,000 Units  5,000 Units Subcutaneous 3 times per day Elenora GammaSamuel L Bradshaw, MD   5,000 Units at 02/10/14 0700  . ibuprofen (ADVIL,MOTRIN) tablet 600 mg  600 mg Oral TID Quintella Reichertraci R Turner, MD   600 mg at 02/10/14 0953  . morphine 2 MG/ML injection 2 mg  2 mg Intravenous Q2H PRN Shirlee LatchAngela Bacigalupo, MD   2 mg at 02/10/14 0953  . ondansetron (ZOFRAN) tablet 4 mg  4 mg Oral Q6H PRN Elenora GammaSamuel L Bradshaw, MD       Or  . ondansetron [place](ZOFRAN)  injection 4 mg  4 mg Intravenous Q6H PRN Elenora GammaSamuel L Bradshaw, MD   4 mg at 02/10/14 [phone]  . promethazine (PHENERGAN) injection 12.5 mg  12.5 mg Intravenous Q6H PRN Shirlee LatchAngela Bacigalupo, MD   12.5 mg at 02/10/14 0953  . sodium chloride 0.9 % injection 3 mL  3 mL Intravenous Q12H Elenora GammaSamuel L Bradshaw, MD   3 mL at 02/10/14 1003    Prescriptions prior to admission  Medication Sig Dispense Refill  . Ascorbic Acid (VITAMIN C PO) Take 1 tablet by mouth daily.       . B Complex Vitamins (VITAMIN B COMPLEX PO) Take 1 tablet by mouth daily.         History reviewed. No pertinent family history.   Review of Systems:     Cardiac Review of Systems: Y or N  Chest Pain [   yes ]  Resting SOB [  no ] Exertional SOB  [ no ]  Orthopnea [no  ]   Pedal Edema [ no  ]    Palpitations [no  ] Syncope  [  ]   Presyncope [  no ]  General Review of Systems: [Y] = yes [  ]=no Constitional: recent weight change [  ]; anorexia [  ]; fatigue [  ]; nausea [  ]; night sweats [  ]; fever [  ]; or chills [  ]                                                               Dental: poor dentition[  ]; Last Dentist visit: Every 6 months  Eye : blurred vision [  ]; diplopia [   ]; vision changes [  ];  Amaurosis fugax[  ]; Resp: cough [  ];  wheezing[  ];  hemoptysis[  ]; shortness of breath[  ]; paroxysmal nocturnal dyspnea[  ]; dyspnea on exertion[  ]; or orthopnea[  ];  GI:  gallstones[  ], vomiting[  ];  dysphagia[  ]; melena[  ];  hematochezia [  ]; heartburn[  ];   Hx of  Colonoscopy[  ]; GU: kidney stones [  ]; hematuria[  ];   dysuria [  ];  nocturia[  ];  history of     obstruction [  ]; urinary frequency [  ]             Skin: rash, swelling[  ];, hair loss[  ];  peripheral edema[  ];  or itching[  ]; Musculosketetal: myalgias[  ];  joint swelling[  ];  joint erythema[  ];  joint pain[  ];  back pain[  ];  Heme/Lymph: bruising[  ];  bleeding[  ];  anemia[  ];  Neuro: TIA[  ];  headaches[  ];  stroke[  ];  vertigo[  ];   seizures[  ];   paresthesias[  ];  difficulty walking[  ];  Psych:depression[  ]; anxiety[  ];  Endocrine: diabetes[  ];  thyroid dysfunction[  ];  Immunizations: Flu [  ]; Pneumococcal[  ];  Other:  Physical Exam: BP 129/85  Pulse 116  Temp(Src) 97.7 F (36.5 C) (Oral)  Resp 26  Ht 5\' 3"  (1.6 m)  Wt 155 lb 10.3 oz (70.6 kg)  BMI 27.58 kg/m2  SpO2 90%  LMP 02/07/2014  Exam General appearance-healthy appearing middle-aged female lying supine comfortably in no acute distress HEENT-normocephalic pupils equal dentition good Neck-no JVD adenopathy or carotid bruit noted Thorax-without deformity or tenderness, breath sounds clear and equal bilaterally Cardiac-increased heart rate, no murmur rub or gallop Abdomen-mildly obese soft nontender without pulsatile mass Extremities- Warm well perfused with good pulses, no clubbing cyanosis edema or tenderness Neurologic-alert and oriented nonfocal, good strength   Diagnostic Studies & Laboratory data:     Recent Radiology Findings:   Dg Chest 2 View  02/09/2014   CLINICAL DATA:  Initial in caliber for 3 day history of left-sided chest and shoulder pain with chest tightness and headache.  EXAM: CHEST  2 VIEW  COMPARISON:  01/26/2014  FINDINGS: Two view exam of the chest shows new retrocardiac airspace disease in the left lower lobe. Right lung is clear. No pulmonary edema or pleural effusion. The cardio pericardial silhouette is enlarged. Imaged bony structures of the thorax are intact.  IMPRESSION: New airspace disease at the left lung base, compatible with pneumonia. Followup imaging is recommended to ensure complete resolution.   Electronically Signed   By: Kennith Center M.D.   On: 02/09/2014 18:13   Ct Angio Chest Pe W/cm &/or Wo Cm  02/09/2014   CLINICAL DATA:  Initial encounter for shortness of breath and chest pain.  EXAM: CT ANGIOGRAPHY CHEST WITH CONTRAST  TECHNIQUE: Multidetector CT imaging of the chest was performed using the standard  protocol during bolus administration of intravenous contrast. Multiplanar CT image reconstructions and MIPs were obtained to evaluate the vascular anatomy.  CONTRAST:  OMNIPAQUE IOHEXOL 350 MG/ML SOLN  COMPARISON:  None.  FINDINGS: Mediastinum: The heart size appears normal. There is a moderate pericardial effusion identified. The trachea appears patent and is midline. Normal appearance of the esophagus. Prominent mediastinal and hilar lymph nodes are identified. Pre-vascular lymph node is enlarged measuring 1.3 cm, image 24/series 4. Sub- carinal lymph node measures 1.2 cm, image 34/series 4. No supraclavicular or axillary adenopathy. The main pulmonary artery appears normal. No lobar or segmental pulmonary artery filling defects identified to suggest a clinically significant pulmonary embolus.  Lungs/Pleura: There is a small left pleural effusion. Dependent changes in atelectasis  is noted in the lung bases. Subpleural nodule in the periphery of the right lower lobe measures 4 mm, image 46/series 4.  Upper Abdomen: The visualized portions of the liver and spleen are unremarkable. The visualized portions of the adrenal glands are unremarkable.  Musculoskeletal: Review of the visualized osseous structures is remarkable for mild multi level disc space narrowing and ventral endplate spurring.  Review of the MIP images confirms the above findings.  IMPRESSION: 1. No evidence for acute pulmonary embolus. 2. Moderate pericardial effusion. 3. Enlarged mediastinal lymph nodes are identified. This is a nonspecific finding but may be seen with metastatic adenopathy, lymphoproliferative disorder as well as granulomatous inflammation or infection. Careful clinical correlation advise. 4. Lower lobe dependent changes and atelectasis. 5. Nonspecific right lower lobe pulmonary nodule measures 4 mm. If the patient is at high risk for bronchogenic carcinoma, follow-up chest CT at 1 year is recommended. If the patient is at low  risk, no follow-up is needed. This recommendation follows the consensus statement: Guidelines for Management of Small Pulmonary Nodules Detected on CT Scans: A Statement from the Fleischner Society as published in Radiology 2005; 237:395-400.   Electronically Signed   By: Signa Kell M.D.   On: 02/09/2014 19:28      Recent Lab Findings: Lab Results  Component Value Date   WBC 10.0 02/10/2014   HGB 11.3* 02/10/2014   HCT 34.0* 02/10/2014   PLT 235 02/10/2014   GLUCOSE 125* 02/10/2014   CHOL 114 02/10/2014   TRIG 38 02/10/2014   HDL 37* 02/10/2014   LDLCALC 69 02/10/2014   NA 142 02/10/2014   K 3.8 02/10/2014   CL 105 02/10/2014   CREATININE 0.63 02/10/2014   BUN 9 02/10/2014   CO2 26 02/10/2014   TSH 2.860 02/10/2014   HGBA1C 5.8* 02/10/2014      Assessment / Plan:     Patient's pain probably related to pericarditis and she would benefit from subxiphoid pericardial window with drainage of effusion and analysis of the pericardial tissue and fluid.  Biopsy of the AP window adenopathy would also be indicated to help establish diagnosis-a left mediastinotomy-Chamberlain procedure will be done to obtain tissue for pathology.  We'll schedule both procedures to be done under anesthesia at first available OR opening-7 AM October 8. Procedures, indications, benefits and alternatives, discussed with patient and she understands and agrees to proceed with surgery.       @ME1 @ 02/10/2014 1:45 PM

## 2014-02-10 NOTE — Progress Notes (Signed)
Family Medicine Teaching Service Attending Note  I discussed patient Sharon Case  with Dr. B and reviewed their note for today.  I agree with their assessment and plan.      

## 2014-02-10 NOTE — ED Provider Notes (Signed)
Medical screening examination/treatment/procedure(s) were conducted as a shared visit with non-physician practitioner(s) and myself.  I personally evaluated the patient during the encounter.   EKG Interpretation   Date/Time:  Monday February 09 2014 16:35:08 EDT Ventricular Rate:  125 PR Interval:  116 QRS Duration: 72 QT Interval:  278 QTC Calculation: 401 R Axis:   61 Text Interpretation:  Sinus tachycardia Otherwise normal ECG tachycardia  new since previous  Confirmed by YAO  MD, DAVID ([id number]) on 02/09/2014  4:39:06 PM      Sharon Case is a 53 y.o. female otherwise healthy here with chest pain, shortness of breath. Worse with leaning forward. Associated with SOB. Came 2 weeks ago and trop neg x 2 and was sent home. Saw cardiology today and was sent in for possible PE vs percardial effusion. On exam, patient tachy but no hypoxic or hypotensive. No obvious JVD, heart sounds not distant. No obvious leg swelling. Lungs clear. D-dimer elevated. CT showed pericardial effusion with enlarged mediastinal nodes and pulm nodules. Bedside Echo showed no obvious tamponade but does have mod effusion. Cardiology consulted, will admit to step down under Family medicine. Will get stat echo tonight.   CRITICAL CARE Performed by: Silverio LayYAO, DAVID   Total critical care time: 30 min   Critical care time was exclusive of separately billable procedures and treating other patients.  Critical care was necessary to treat or prevent imminent or life-threatening deterioration.  Critical care was time spent personally by me on the following activities: development of treatment plan with patient and/or surrogate as well as nursing, discussions with consultants, evaluation of patient's response to treatment, examination of patient, obtaining history from patient or surrogate, ordering and performing treatments and interventions, ordering and review of laboratory studies, ordering and review of radiographic  studies, pulse oximetry and re-evaluation of patient's condition.    EMERGENCY DEPARTMENT US CARDIAC EXAM "Study: Limited Ultrasound of the heart and pericardium"  INDICATIONS:Tachycardia Multiple views of the heart and pericardium are obtained with a multi-frequency probe.  PERFORMED WU:[id number]:Myself  IMAGES ARCHIVED?: Yes  FINDINGS: Moderate effusion  LIMITATIONS:  Emergent procedure  VIEWS USED: Subcostal 4 chamber, Parasternal long axis, Parasternal short axis and Apical 4 chamber   INTERPRETATION: Cardiac activity present, Pericardial effusion present and Cardiac tamponade absent  COMMENT:  Mod effusion, no obvious tamponade.      Richardean Canalavid H Yao, MD 02/10/14 [phone]

## 2014-02-10 NOTE — Progress Notes (Signed)
Utilization Review Completed.  

## 2014-02-10 NOTE — H&P (Signed)
Seen and examined.  Discussed with Dr. Ermalinda MemosBradshaw.  Agree with admit and his documentation and management.  Briefly, 53 yo female with chest pain found to have pericardial effusion.  Feels better this morning.  No signs or symptoms of hemodynamic compromise (i.e. No tamponade).  Issues 1. Pericarditis.  Long differential but viral pericarditis seems most likely.  I suspect the lymph nodes seen on CT are reactive.  I will defer WU to cards.  I doubt she needs a therapeutic tap but we can still consider a diagnostic tap.  I suspect we will treat medically and monitor for likely resolution. 2. Incidental 4 mm lung lesion which will need CT follow up with time.

## 2014-02-10 NOTE — Progress Notes (Signed)
Family Medicine Teaching Service Daily Progress Note Intern Pager: [phone]  Patient name: Sharon Case Medical record number: [id number] Date of birth: Oct 30, 1960 Age: 53 y.o. Gender: female  Primary Care Provider: No PCP Per Patient Consultants: Cardiology, pulmonology Code Status: Full code  Pt Overview and Major Events to Date:  10/5 - Admit to FPTS for pericardial effusion and leukocytosis  Assessment and Plan:  Nirvana Blanchett is a 53 y.o. female presenting with Chest pain and new found pericardial effusion, 4 mm lung nodule, and large mediastinal lymph nodes.     Chest pain, Pericardial Effusion - Moderate pericardial effusion on Echo, Hemodynamically stable. Troponin neg x1, EKG without ischemic changes. - risk stratification labs pending - A1C, TSH, Lipid panel - NPO given possibility of procedural need  - Morphine, tylenol PRN for pain  - Cardiology following, appreciate recs - Continue colchicine 0.36m BID and Ibuprofen 6061mTID - Zofran and phenergan for nausea  Lung nodule, pleural effusion, mediastinal lymph nodes: Considering the cionglomeration of her lung nodule, mediastinal lymph nodes, and pleural effusion, ddx includes neoplasm, infectious process, or inflammatory process. Considering only a few non specific signs for infectious process (leukocytosis and chills) and lack of other signs of inflammatory conditions I think malignant etiology should be considered strongly but carefully given this very healthy patient. - Consult pulmonology today. Consider possibility of biopsy - ESR 50  Leukocytosis (resolved) - WBC 12.1>10. Unlikely infectious. VSS, afebrile.    - Possible etiology infectious, inflammatory, and neoplasia related as above  - Will monitor for additional signs of infection and have low threshold for treating CAP   FEN/GI: NPO pending need for procedure, IV NS at 125 mL/hr, PRN zofran  Prophylaxis: sub q heparin  Disposition: In SDU. Dispo  pending further work-up.  Subjective:  Reports continuing CP that is worse with movement.  No SOB.  Objective: Temp:  [98.2 F (36.8 C)-98.4 F (36.9 C)] 98.4 F (36.9 C) (10/06 0800) Pulse Rate:  [97-125] 110 (10/06 0800) Resp:  [18-37] 24 (10/06 0800) BP: (113-138)/(64-84) 131/76 mmHg (10/06 0800) SpO2:  [91 %-97 %] 91 % (10/06 0800) Weight:  [153 lb (69.4 kg)-156 lb 3.2 oz (70.852 kg)] 155 lb 10.3 oz (70.6 kg) (10/06 0420) Physical Exam: Gen: NAD, alert, cooperative with exam  HEENT: NCAT, EOMI, MMM  CV: Muffled heart sounds. RRR, no murmur  Resp: CTABL, no wheezes, non-labored  Abd: SNTND, BS present, no guarding or organomegaly  Ext: No edema, warm, no rashes Neuro: Alert and conversational, normal speech   Laboratory:  Recent Labs Lab 02/09/14 1701 02/10/14 0320  WBC 12.3* 10.0  HGB 12.7 11.3*  HCT 38.5 34.0*  PLT 262 235    Recent Labs Lab 02/09/14 1701 02/10/14 0320  NA 136* 142  K 3.9 3.8  CL 98 105  CO2 24 26  BUN 9 9  CREATININE 0.81 0.63  CALCIUM 9.0 7.9*  GLUCOSE 117* 125*    D dimer 0.81  istat Troponin 0.0  TSH 2.86 ESR 50  Lipid Panel     Component Value Date/Time   CHOL 114 02/10/2014 0320   TRIG 38 02/10/2014 0320   HDL 37* 02/10/2014 0320   CHOLHDL 3.1 02/10/2014 0320   VLDL 8 02/10/2014 0320   LDLCALC 69 02/10/2014 0320    Imaging/Diagnostic Tests: DG chest 02/09/2014 :  New airspace disease at the left lung base, compatible with  pneumonia. Followup imaging is recommended to ensure complete  resolution.   CT angio 10/5  1. No  evidence for acute pulmonary embolus.  2. Moderate pericardial effusion.  3. Enlarged mediastinal lymph nodes are identified. This is a  nonspecific finding but may be seen with metastatic adenopathy,  lymphoproliferative disorder as well as granulomatous inflammation  or infection. Careful clinical correlation advise.  4. Lower lobe dependent changes and atelectasis.  5. Nonspecific right lower lobe  pulmonary nodule measures 4 mm. If the patient is at high risk for bronchogenic carcinoma, follow-up chest CT at 1 year is recommended. If the patient is at low risk, no follow-up is needed. This recommendation follows the consensus statement: Guidelines for Management of Small Pulmonary Nodules  Detected on CT Scans: A Statement from the [place] as published in Radiology 2005; 237:395-400.   EKG (10/5) with sinus tach and poor R wave progression   Lavon Paganini, MD 02/10/2014, 12:36 PM PGY-1, [place] Intern pager: [phone], text pages welcome

## 2014-02-11 ENCOUNTER — Inpatient Hospital Stay (HOSPITAL_COMMUNITY): Payer: BC Managed Care – PPO

## 2014-02-11 LAB — PULMONARY FUNCTION TEST
FEF 25-75 Post: 1.08 L/sec
FEF 25-75 Pre: 1.04 L/sec
FEF2575-%Change-Post: 4 %
FEF2575-%Pred-Post: 47 %
FEF2575-%Pred-Pre: 45 %
FEV1-%Change-Post: 0 %
FEV1-%Pred-Post: 43 %
FEV1-%Pred-Pre: 43 %
FEV1-Post: 0.94 L
FEV1-Pre: 0.94 L
FEV1FVC-%Change-Post: 8 %
FEV1FVC-%Pred-Pre: 103 %
FEV6-%Change-Post: -8 %
FEV6-%Pred-Post: 39 %
FEV6-%Pred-Pre: 42 %
FEV6-Post: 1.03 L
FEV6-Pre: 1.12 L
FEV6FVC-%Pred-Post: 103 %
FEV6FVC-%Pred-Pre: 103 %
FVC-%Change-Post: -8 %
FVC-%Pred-Post: 37 %
FVC-%Pred-Pre: 41 %
FVC-Post: 1.03 L
FVC-Pre: 1.12 L
Post FEV1/FVC ratio: 91 %
Post FEV6/FVC ratio: 100 %
Pre FEV1/FVC ratio: 84 %
Pre FEV6/FVC Ratio: 100 %
RV % pred: 61 %
RV: 1.11 L
TLC % pred: 44 %
TLC: 2.2 L

## 2014-02-11 LAB — PROTIME-INR
INR: 1.54 — ABNORMAL HIGH (ref 0.00–1.49)
Prothrombin Time: 18.5 seconds — ABNORMAL HIGH (ref 11.6–15.2)

## 2014-02-11 LAB — BLOOD GAS, ARTERIAL
Acid-base deficit: 1.2 mmol/L (ref 0.0–2.0)
Bicarbonate: 22.9 mEq/L (ref 20.0–24.0)
Drawn by: 36277
O2 Content: 2 L/min
O2 Saturation: 94.9 %
Patient temperature: 98.6
TCO2: 24.1 mmol/L (ref 0–100)
pCO2 arterial: 37.9 mmHg (ref 35.0–45.0)
pH, Arterial: 7.399 (ref 7.350–7.450)
pO2, Arterial: 79 mmHg — ABNORMAL LOW (ref 80.0–100.0)

## 2014-02-11 LAB — ANCA SCREEN W REFLEX TITER
Atypical p-ANCA Screen: NEGATIVE
P-ANCA SCREEN: NEGATIVE
c-ANCA Screen: NEGATIVE

## 2014-02-11 LAB — ANTI-SCLERODERMA ANTIBODY: Scleroderma (Scl-70) (ENA) Antibody, IgG: <1

## 2014-02-11 LAB — APTT: aPTT: 37 seconds (ref 24–37)

## 2014-02-11 LAB — ANTI-DNA ANTIBODY, DOUBLE-STRANDED: ds DNA Ab: <1 IU/mL

## 2014-02-11 LAB — ANA: Anti Nuclear Antibody(ANA): NEGATIVE

## 2014-02-11 MED ORDER — SODIUM CHLORIDE 0.9 % IV SOLN
INTRAVENOUS | Status: DC
  Administered 2014-02-12 (×2): via INTRAVENOUS

## 2014-02-11 MED ORDER — ALBUTEROL SULFATE (2.5 MG/3ML) 0.083% IN NEBU
2.5000 mg | INHALATION_SOLUTION | Freq: Once | RESPIRATORY_TRACT | Status: AC
  Administered 2014-02-11: 2.5 mg via RESPIRATORY_TRACT

## 2014-02-11 NOTE — Progress Notes (Signed)
Family Medicine Teaching Service Daily Progress Note Intern Pager: [phone]  Patient name: Sharon Case Medical record number: [id number] Date of birth: June 17, 1960 Age: 52 y.o. Gender: female  Primary Care Provider: No PCP Per Patient Consultants: Cardiology, pulmonology, CTVS Code Status: Full code  Pt Overview and Major Events to Date:  10/5 - Admit to FPTS for pericardial effusion and leukocytosis  Assessment and Plan:  Conor Filsaime is a 53 y.o. female presenting with Chest pain and new found pericardial effusion, 4 mm lung nodule, and large mediastinal lymph nodes.     Chest pain, Pericardial Effusion - Moderate pericardial effusion on Echo, Hemodynamically stable. Troponin neg x1, EKG without ischemic changes. - CTVS to do pericardial window and biopsy 10/8  - Morphine, tylenol PRN for pain. Will reassess pain control post-Op  - Cardiology and CTVS following, appreciate recs - Continue colchicine 0.50m BID and Ibuprofen 6048mTID - Zofran and phenergan for nausea  Lung nodule, pleural effusion, mediastinal lymph nodes: Ddx includes neoplasm, infectious process (unlikely), or inflammatory/autoimmune process. - Pulmonology following, appreciate recs - F/u immune labs - f/u biopsy results - ESR 50  Leukocytosis (resolved) - WBC 12.1>10. Unlikely infectious. VSS, afebrile.     - Will monitor for additional signs of infection and have low threshold for treating CAP  FEN/GI: Heart healthy diet, NPO p MN, IV NS at 125 mL/hr p MN, PRN zofran  Prophylaxis: sub q heparin  Disposition: In SDU. Dispo pending further work-up.  Subjective:  Reports continuing CP that is worse with movement.  No SOB. She understands that she is getting surgery tomorrow. No questions. No Fmhx of rheumatologic conditions.  Objective: Temp:  [98 F (36.7 C)-98.4 F (36.9 C)] 98.1 F (36.7 C) (10/07 0756) Pulse Rate:  [84-115] 84 (10/07 0417) Resp:  [20-34] 20 (10/07 0417) BP:  (111-139)/(62-90) 129/62 mmHg (10/07 0417) SpO2:  [90 %-100 %] 97 % (10/07 0417) Weight:  [155 lb 10.3 oz (70.6 kg)] 155 lb 10.3 oz (70.6 kg) (10/07 0417) Physical Exam: Gen: NAD, alert, cooperative with exam  HEENT: NCAT, EOMI, MMM  CV: Muffled heart sounds. Tachycardic. Reg rhythm, no murmur  Resp: CTABL, no wheezes, non-labored  Abd: SNTND, BS present, no guarding or organomegaly  Ext: No edema, warm, no rashes Neuro: Alert and conversational, normal speech   Laboratory:  Recent Labs Lab 02/09/14 1701 02/10/14 0320  WBC 12.3* 10.0  HGB 12.7 11.3*  HCT 38.5 34.0*  PLT 262 235    Recent Labs Lab 02/09/14 1701 02/10/14 0320  NA 136* 142  K 3.9 3.8  CL 98 105  CO2 24 26  BUN 9 9  CREATININE 0.81 0.63  CALCIUM 9.0 7.9*  GLUCOSE 117* 125*    D dimer 0.81  istat Troponin 0.0  TSH 2.86 Hgb A1c 5.8 ESR 50 CRP 17.2  Serum AE 14 (wnl) Rheumatoid factor 20 (elevated)  Lipid Panel     Component Value Date/Time   CHOL 114 02/10/2014 0320   TRIG 38 02/10/2014 0320   HDL 37* 02/10/2014 0320   CHOLHDL 3.1 02/10/2014 0320   VLDL 8 02/10/2014 0320   LDLCALC 69 02/10/2014 0320    Imaging/Diagnostic Tests: DG chest 02/09/2014 :  New airspace disease at the left lung base, compatible with  pneumonia. Followup imaging is recommended to ensure complete  resolution.   CT angio 10/5  1. No evidence for acute pulmonary embolus.  2. Moderate pericardial effusion.  3. Enlarged mediastinal lymph nodes are identified. This is a  nonspecific  finding but may be seen with metastatic adenopathy,  lymphoproliferative disorder as well as granulomatous inflammation  or infection. Careful clinical correlation advise.  4. Lower lobe dependent changes and atelectasis.  5. Nonspecific right lower lobe pulmonary nodule measures 4 mm. If the patient is at high risk for bronchogenic carcinoma, follow-up chest CT at 1 year is recommended. If the patient is at low risk, no follow-up is  needed. This recommendation follows the consensus statement: Guidelines for Management of Small Pulmonary Nodules  Detected on CT Scans: A Statement from the [place] as published in Radiology 2005; 237:395-400.   EKG (10/5) with sinus tach and poor R wave progression   Lavon Paganini, MD 02/11/2014, 2:29 PM PGY-1, [place] Intern pager: [phone], text pages welcome

## 2014-02-11 NOTE — Progress Notes (Signed)
Family Medicine Teaching Service Attending Note  I discussed patient Sharon Case  with Dr. B and reviewed their note for today.  I agree with their assessment and plan.

## 2014-02-12 ENCOUNTER — Encounter (HOSPITAL_COMMUNITY): Payer: Self-pay | Admitting: Certified Registered Nurse Anesthetist

## 2014-02-12 ENCOUNTER — Ambulatory Visit: Payer: BC Managed Care – PPO | Admitting: Internal Medicine

## 2014-02-12 ENCOUNTER — Encounter (HOSPITAL_COMMUNITY): Payer: BC Managed Care – PPO | Admitting: Anesthesiology

## 2014-02-12 ENCOUNTER — Inpatient Hospital Stay (HOSPITAL_COMMUNITY): Payer: BC Managed Care – PPO

## 2014-02-12 ENCOUNTER — Inpatient Hospital Stay (HOSPITAL_COMMUNITY): Payer: BC Managed Care – PPO | Admitting: Anesthesiology

## 2014-02-12 ENCOUNTER — Encounter (HOSPITAL_COMMUNITY)
Admission: EM | Disposition: A | Discharge status: Home / Self Care | Payer: Self-pay | Source: Home / Self Care | Attending: Cardiothoracic Surgery

## 2014-02-12 DIAGNOSIS — R599 Enlarged lymph nodes, unspecified: Secondary | ICD-10-CM

## 2014-02-12 HISTORY — PX: THORACOTOMY: SHX5074

## 2014-02-12 HISTORY — PX: SUBXYPHOID PERICARDIAL WINDOW: SHX5075

## 2014-02-12 LAB — PREPARE RBC (CROSSMATCH)

## 2014-02-12 LAB — POCT I-STAT 3, ART BLOOD GAS (G3+)
ACID-BASE EXCESS: 1 mmol/L (ref 0.0–2.0)
Bicarbonate: 25.7 mEq/L — ABNORMAL HIGH (ref 20.0–24.0)
O2 SAT: 91 %
PCO2 ART: 38.8 mmHg (ref 35.0–45.0)
Patient temperature: 98.2
TCO2: 27 mmol/L (ref 0–100)
pH, Arterial: 7.428 (ref 7.350–7.450)
pO2, Arterial: 60 mmHg — ABNORMAL LOW (ref 80.0–100.0)

## 2014-02-12 LAB — GLUCOSE, CAPILLARY
GLUCOSE-CAPILLARY: 131 mg/dL — AB (ref 70–99)
Glucose-Capillary: 165 mg/dL — ABNORMAL HIGH (ref 70–99)

## 2014-02-12 LAB — ABO/RH: ABO/RH(D): A POS

## 2014-02-12 SURGERY — CREATION, PERICARDIAL WINDOW, SUBXIPHOID APPROACH
Anesthesia: General | Site: Chest

## 2014-02-12 MED ORDER — ACETAMINOPHEN 160 MG/5ML PO SOLN
1000.0000 mg | Freq: Four times a day (QID) | ORAL | Status: AC
  Filled 2014-02-12: qty 40

## 2014-02-12 MED ORDER — ARTIFICIAL TEARS OP OINT
TOPICAL_OINTMENT | OPHTHALMIC | Status: DC | PRN
  Administered 2014-02-12: 1 via OPHTHALMIC

## 2014-02-12 MED ORDER — POTASSIUM CHLORIDE 10 MEQ/50ML IV SOLN
10.0000 meq | Freq: Every day | INTRAVENOUS | Status: DC | PRN
  Filled 2014-02-12: qty 50

## 2014-02-12 MED ORDER — 0.9 % SODIUM CHLORIDE (POUR BTL) OPTIME
TOPICAL | Status: DC | PRN
  Administered 2014-02-12: 2000 mL

## 2014-02-12 MED ORDER — SUCCINYLCHOLINE CHLORIDE 20 MG/ML IJ SOLN
INTRAMUSCULAR | Status: AC
  Filled 2014-02-12: qty 1

## 2014-02-12 MED ORDER — MIDAZOLAM HCL 5 MG/5ML IJ SOLN
INTRAMUSCULAR | Status: DC | PRN
  Administered 2014-02-12: 2 mg via INTRAVENOUS

## 2014-02-12 MED ORDER — LIDOCAINE HCL (CARDIAC) 20 MG/ML IV SOLN
INTRAVENOUS | Status: AC
  Filled 2014-02-12: qty 5

## 2014-02-12 MED ORDER — ONDANSETRON HCL 4 MG/2ML IJ SOLN
4.0000 mg | Freq: Four times a day (QID) | INTRAMUSCULAR | Status: DC | PRN
  Filled 2014-02-12: qty 2

## 2014-02-12 MED ORDER — SENNOSIDES-DOCUSATE SODIUM 8.6-50 MG PO TABS
1.0000 | ORAL_TABLET | Freq: Every day | ORAL | Status: DC
  Administered 2014-02-12 – 2014-02-15 (×3): 1 via ORAL
  Filled 2014-02-12 (×7): qty 1

## 2014-02-12 MED ORDER — ONDANSETRON HCL 4 MG/2ML IJ SOLN
4.0000 mg | Freq: Four times a day (QID) | INTRAMUSCULAR | Status: DC | PRN
  Administered 2014-02-14: 4 mg via INTRAVENOUS
  Filled 2014-02-12: qty 2

## 2014-02-12 MED ORDER — SODIUM CHLORIDE 0.9 % IJ SOLN
OROMUCOSAL | Status: DC | PRN
  Administered 2014-02-12 (×2): via TOPICAL

## 2014-02-12 MED ORDER — NEOSTIGMINE METHYLSULFATE 10 MG/10ML IV SOLN
INTRAVENOUS | Status: DC | PRN
  Administered 2014-02-12: 4 mg via INTRAVENOUS
  Administered 2014-02-12: 1 mg via INTRAVENOUS

## 2014-02-12 MED ORDER — PROPOFOL 10 MG/ML IV BOLUS
INTRAVENOUS | Status: AC
  Filled 2014-02-12: qty 20

## 2014-02-12 MED ORDER — HYDROMORPHONE HCL 1 MG/ML IJ SOLN
INTRAMUSCULAR | Status: AC
  Filled 2014-02-12: qty 1

## 2014-02-12 MED ORDER — FENTANYL CITRATE 0.05 MG/ML IJ SOLN
INTRAMUSCULAR | Status: DC | PRN
  Administered 2014-02-12: 100 ug via INTRAVENOUS
  Administered 2014-02-12 (×2): 50 ug via INTRAVENOUS
  Administered 2014-02-12 (×2): 100 ug via INTRAVENOUS

## 2014-02-12 MED ORDER — GLYCOPYRROLATE 0.2 MG/ML IJ SOLN
INTRAMUSCULAR | Status: DC | PRN
  Administered 2014-02-12: 0.1 mg via INTRAVENOUS
  Administered 2014-02-12: .5 mg via INTRAVENOUS

## 2014-02-12 MED ORDER — FENTANYL CITRATE 0.05 MG/ML IJ SOLN
INTRAMUSCULAR | Status: AC
  Filled 2014-02-12: qty 5

## 2014-02-12 MED ORDER — BISACODYL 5 MG PO TBEC
10.0000 mg | DELAYED_RELEASE_TABLET | Freq: Every day | ORAL | Status: DC
  Administered 2014-02-13 – 2014-02-17 (×5): 10 mg via ORAL
  Filled 2014-02-12 (×5): qty 2

## 2014-02-12 MED ORDER — DEXTROSE 5 % IV SOLN
1.5000 g | Freq: Two times a day (BID) | INTRAVENOUS | Status: AC
  Administered 2014-02-12 – 2014-02-13 (×2): 1.5 g via INTRAVENOUS
  Filled 2014-02-12 (×2): qty 1.5

## 2014-02-12 MED ORDER — DIPHENHYDRAMINE HCL 12.5 MG/5ML PO ELIX
12.5000 mg | ORAL_SOLUTION | Freq: Four times a day (QID) | ORAL | Status: DC | PRN
  Filled 2014-02-12: qty 5

## 2014-02-12 MED ORDER — INSULIN ASPART 100 UNIT/ML ~~LOC~~ SOLN
0.0000 [IU] | Freq: Four times a day (QID) | SUBCUTANEOUS | Status: DC
  Administered 2014-02-12: 4 [IU] via SUBCUTANEOUS
  Administered 2014-02-13 (×2): 2 [IU] via SUBCUTANEOUS

## 2014-02-12 MED ORDER — DIPHENHYDRAMINE HCL 50 MG/ML IJ SOLN
12.5000 mg | Freq: Four times a day (QID) | INTRAMUSCULAR | Status: DC | PRN
  Filled 2014-02-12: qty 0.25

## 2014-02-12 MED ORDER — KCL IN DEXTROSE-NACL 20-5-0.45 MEQ/L-%-% IV SOLN
INTRAVENOUS | Status: DC
  Administered 2014-02-12: 100 mL via INTRAVENOUS
  Administered 2014-02-12 – 2014-02-13 (×2): via INTRAVENOUS
  Filled 2014-02-12 (×5): qty 1000

## 2014-02-12 MED ORDER — OXYCODONE HCL 5 MG PO TABS
5.0000 mg | ORAL_TABLET | ORAL | Status: DC | PRN
  Administered 2014-02-13 – 2014-02-15 (×2): 10 mg via ORAL
  Administered 2014-02-15 (×2): 5 mg via ORAL
  Administered 2014-02-15: 10 mg via ORAL
  Administered 2014-02-15: 5 mg via ORAL
  Administered 2014-02-16 – 2014-02-18 (×5): 10 mg via ORAL
  Filled 2014-02-12 (×2): qty 2
  Filled 2014-02-12: qty 1
  Filled 2014-02-12 (×4): qty 2
  Filled 2014-02-12 (×2): qty 1
  Filled 2014-02-12 (×2): qty 2

## 2014-02-12 MED ORDER — CEFUROXIME SODIUM 1.5 G IJ SOLR
1.5000 g | INTRAMUSCULAR | Status: AC
  Administered 2014-02-12: 1.5 g via INTRAVENOUS
  Filled 2014-02-12 (×4): qty 1.5

## 2014-02-12 MED ORDER — IBUPROFEN 600 MG PO TABS
600.0000 mg | ORAL_TABLET | Freq: Three times a day (TID) | ORAL | Status: DC
  Administered 2014-02-13 – 2014-02-17 (×12): 600 mg via ORAL
  Filled 2014-02-12 (×16): qty 1

## 2014-02-12 MED ORDER — DOXYCYCLINE HYCLATE 100 MG PO TABS
50.0000 mg | ORAL_TABLET | Freq: Two times a day (BID) | ORAL | Status: AC
  Administered 2014-02-12 – 2014-02-16 (×10): 50 mg via ORAL
  Filled 2014-02-12 (×10): qty 0.5

## 2014-02-12 MED ORDER — HYDROMORPHONE HCL 1 MG/ML IJ SOLN
0.2500 mg | INTRAMUSCULAR | Status: DC | PRN
  Administered 2014-02-12 (×2): 0.5 mg via INTRAVENOUS

## 2014-02-12 MED ORDER — TRAMADOL HCL 50 MG PO TABS
50.0000 mg | ORAL_TABLET | Freq: Four times a day (QID) | ORAL | Status: DC | PRN
  Administered 2014-02-13 – 2014-02-14 (×2): 100 mg via ORAL
  Administered 2014-02-14: 50 mg via ORAL
  Administered 2014-02-15: 100 mg via ORAL
  Filled 2014-02-12 (×3): qty 2
  Filled 2014-02-12: qty 1

## 2014-02-12 MED ORDER — PHENYLEPHRINE HCL 10 MG/ML IJ SOLN
10.0000 mg | INTRAVENOUS | Status: DC | PRN
  Administered 2014-02-12: 50 ug/min via INTRAVENOUS

## 2014-02-12 MED ORDER — STERILE WATER FOR INJECTION IJ SOLN
INTRAMUSCULAR | Status: AC
  Filled 2014-02-12: qty 10

## 2014-02-12 MED ORDER — LABETALOL HCL 5 MG/ML IV SOLN
INTRAVENOUS | Status: DC | PRN
  Administered 2014-02-12 (×2): 5 mg via INTRAVENOUS

## 2014-02-12 MED ORDER — PROPOFOL 10 MG/ML IV BOLUS
INTRAVENOUS | Status: DC | PRN
  Administered 2014-02-12: 140 mg via INTRAVENOUS
  Administered 2014-02-12: 10 mg via INTRAVENOUS

## 2014-02-12 MED ORDER — SODIUM CHLORIDE 0.9 % IJ SOLN: 9.0000 mL | INTRAMUSCULAR | Status: DC | PRN

## 2014-02-12 MED ORDER — LABETALOL HCL 5 MG/ML IV SOLN
10.0000 mg | INTRAVENOUS | Status: DC | PRN
  Filled 2014-02-12: qty 4

## 2014-02-12 MED ORDER — DEXAMETHASONE SODIUM PHOSPHATE 4 MG/ML IJ SOLN
INTRAMUSCULAR | Status: DC | PRN
  Administered 2014-02-12: 4 mg via INTRAVENOUS

## 2014-02-12 MED ORDER — KETOROLAC TROMETHAMINE 15 MG/ML IJ SOLN
15.0000 mg | Freq: Four times a day (QID) | INTRAMUSCULAR | Status: AC
  Administered 2014-02-12 – 2014-02-13 (×4): 15 mg via INTRAVENOUS
  Filled 2014-02-12 (×5): qty 1

## 2014-02-12 MED ORDER — ROCURONIUM BROMIDE 50 MG/5ML IV SOLN
INTRAVENOUS | Status: AC
  Filled 2014-02-12: qty 1

## 2014-02-12 MED ORDER — MIDAZOLAM HCL 2 MG/2ML IJ SOLN
INTRAMUSCULAR | Status: AC
  Filled 2014-02-12: qty 2

## 2014-02-12 MED ORDER — ONDANSETRON HCL 4 MG/2ML IJ SOLN
INTRAMUSCULAR | Status: AC
  Filled 2014-02-12: qty 2

## 2014-02-12 MED ORDER — ARTIFICIAL TEARS OP OINT
TOPICAL_OINTMENT | OPHTHALMIC | Status: AC
  Filled 2014-02-12: qty 3.5

## 2014-02-12 MED ORDER — LIDOCAINE HCL (CARDIAC) 20 MG/ML IV SOLN
INTRAVENOUS | Status: DC | PRN
  Administered 2014-02-12: 100 mg via INTRAVENOUS

## 2014-02-12 MED ORDER — ONDANSETRON HCL 4 MG/2ML IJ SOLN
INTRAMUSCULAR | Status: DC | PRN
  Administered 2014-02-12: 4 mg via INTRAVENOUS

## 2014-02-12 MED ORDER — ACETAMINOPHEN 500 MG PO TABS
1000.0000 mg | ORAL_TABLET | Freq: Four times a day (QID) | ORAL | Status: AC
  Administered 2014-02-12 – 2014-02-17 (×18): 1000 mg via ORAL
  Filled 2014-02-12 (×23): qty 2

## 2014-02-12 MED ORDER — FENTANYL 10 MCG/ML IV SOLN
INTRAVENOUS | Status: DC
  Administered 2014-02-12: 120 ug via INTRAVENOUS
  Administered 2014-02-12: 11:00:00 via INTRAVENOUS
  Administered 2014-02-12 – 2014-02-13 (×2): 120 ug via INTRAVENOUS
  Administered 2014-02-13: 90 ug via INTRAVENOUS
  Administered 2014-02-13: 135 ug via INTRAVENOUS
  Administered 2014-02-13: 105 ug via INTRAVENOUS
  Administered 2014-02-13: 73 ug via INTRAVENOUS
  Administered 2014-02-13: 40 ug via INTRAVENOUS
  Administered 2014-02-14: 60 ug via INTRAVENOUS
  Administered 2014-02-14: 90 ug via INTRAVENOUS
  Administered 2014-02-14: 75 ug via INTRAVENOUS
  Administered 2014-02-14: 45 ug via INTRAVENOUS
  Administered 2014-02-14: 75 ug via INTRAVENOUS
  Administered 2014-02-14: 120 ug via INTRAVENOUS
  Administered 2014-02-14: 90 ug via INTRAVENOUS
  Administered 2014-02-14: 01:00:00 via INTRAVENOUS
  Administered 2014-02-15: 75 ug via INTRAVENOUS
  Filled 2014-02-12 (×4): qty 50

## 2014-02-12 MED ORDER — DOXYCYCLINE HYCLATE 50 MG PO CAPS
50.0000 mg | ORAL_CAPSULE | Freq: Two times a day (BID) | ORAL | Status: DC
  Filled 2014-02-12 (×2): qty 1

## 2014-02-12 MED ORDER — LACTATED RINGERS IV SOLN
INTRAVENOUS | Status: DC | PRN
  Administered 2014-02-12 (×4): via INTRAVENOUS

## 2014-02-12 MED ORDER — NALOXONE HCL 0.4 MG/ML IJ SOLN
0.4000 mg | INTRAMUSCULAR | Status: DC | PRN
  Filled 2014-02-12: qty 1

## 2014-02-12 MED ORDER — EPHEDRINE SULFATE 50 MG/ML IJ SOLN
INTRAMUSCULAR | Status: AC
  Filled 2014-02-12: qty 1

## 2014-02-12 MED ORDER — ROCURONIUM BROMIDE 100 MG/10ML IV SOLN
INTRAVENOUS | Status: DC | PRN
  Administered 2014-02-12: 50 mg via INTRAVENOUS

## 2014-02-12 MED ORDER — PANTOPRAZOLE SODIUM 40 MG PO TBEC
40.0000 mg | DELAYED_RELEASE_TABLET | Freq: Every day | ORAL | Status: DC
  Administered 2014-02-13 – 2014-02-17 (×5): 40 mg via ORAL
  Filled 2014-02-12 (×5): qty 1

## 2014-02-12 MED ORDER — NEOSTIGMINE METHYLSULFATE 10 MG/10ML IV SOLN
INTRAVENOUS | Status: AC
  Filled 2014-02-12: qty 2

## 2014-02-12 SURGICAL SUPPLY — 76 items
ATTRACTOMAT 16X20 MAGNETIC DRP (DRAPES) ×5 IMPLANT
BANDAGE HEMOSTAT MRDH 4X4 STRL (MISCELLANEOUS) ×3 IMPLANT
BENZOIN TINCTURE PRP APPL 2/3 (GAUZE/BANDAGES/DRESSINGS) IMPLANT
BLADE SURG 15 STRL LF DISP TIS (BLADE) ×3 IMPLANT
BLADE SURG 15 STRL SS (BLADE) ×2
BNDG HEMOSTAT MRDH 4X4 STRL (MISCELLANEOUS) ×5
CANISTER SUCTION 2500CC (MISCELLANEOUS) ×5 IMPLANT
CATH THORACIC 28FR (CATHETERS) IMPLANT
CATH THORACIC 28FR RT ANG (CATHETERS) IMPLANT
CATH THORACIC 36FR (CATHETERS) IMPLANT
CATH THORACIC 36FR RT ANG (CATHETERS) IMPLANT
CLIP TI MEDIUM 24 (CLIP) IMPLANT
CLOSURE WOUND 1/2 X4 (GAUZE/BANDAGES/DRESSINGS)
CONT SPEC 4OZ CLIKSEAL STRL BL (MISCELLANEOUS) ×20 IMPLANT
COVER SURGICAL LIGHT HANDLE (MISCELLANEOUS) ×10 IMPLANT
DERMABOND ADVANCED (GAUZE/BANDAGES/DRESSINGS) ×4
DERMABOND ADVANCED .7 DNX12 (GAUZE/BANDAGES/DRESSINGS) ×6 IMPLANT
DRAIN CHANNEL 28F RND 3/8 FF (WOUND CARE) ×5 IMPLANT
DRAPE BILATERAL SPLIT (DRAPES) ×5 IMPLANT
DRAPE CHEST BREAST 15X10 FENES (DRAPES) IMPLANT
DRAPE CV SPLIT W-CLR ANES SCRN (DRAPES) ×5 IMPLANT
DRAPE LAPAROSCOPIC ABDOMINAL (DRAPES) ×5 IMPLANT
DRAPE PROXIMA HALF (DRAPES) ×5 IMPLANT
DRSG AQUACEL AG ADV 3.5X14 (GAUZE/BANDAGES/DRESSINGS) IMPLANT
ELECT BLADE 4.0 EZ CLEAN MEGAD (MISCELLANEOUS) ×5
ELECT BLADE 6.5 EXT (BLADE) ×5 IMPLANT
ELECT REM PT RETURN 9FT ADLT (ELECTROSURGICAL) ×10
ELECTRODE BLDE 4.0 EZ CLN MEGD (MISCELLANEOUS) ×3 IMPLANT
ELECTRODE REM PT RTRN 9FT ADLT (ELECTROSURGICAL) ×6 IMPLANT
GAUZE SPONGE 4X4 12PLY STRL (GAUZE/BANDAGES/DRESSINGS) ×5 IMPLANT
GAUZE SPONGE 4X4 16PLY XRAY LF (GAUZE/BANDAGES/DRESSINGS) ×10 IMPLANT
GLOVE BIO SURGEON STRL SZ7.5 (GLOVE) ×20 IMPLANT
GOWN STRL REUS W/ TWL LRG LVL3 (GOWN DISPOSABLE) ×6 IMPLANT
GOWN STRL REUS W/TWL LRG LVL3 (GOWN DISPOSABLE) ×4
HEMOSTAT POWDER SURGIFOAM 1G (HEMOSTASIS) ×10 IMPLANT
HEMOSTAT SURGICEL 2X14 (HEMOSTASIS) IMPLANT
KIT BASIN OR (CUSTOM PROCEDURE TRAY) ×10 IMPLANT
KIT ROOM TURNOVER OR (KITS) ×10 IMPLANT
NS IRRIG 1000ML POUR BTL (IV SOLUTION) ×10 IMPLANT
PACK CHEST (CUSTOM PROCEDURE TRAY) IMPLANT
PACK GENERAL/GYN (CUSTOM PROCEDURE TRAY) ×5 IMPLANT
PAD ARMBOARD 7.5X6 YLW CONV (MISCELLANEOUS) ×10 IMPLANT
PAD ELECT DEFIB RADIOL ZOLL (MISCELLANEOUS) ×5 IMPLANT
SOLUTION ANTI FOG 6CC (MISCELLANEOUS) ×5 IMPLANT
SPONGE INTESTINAL PEANUT (DISPOSABLE) ×10 IMPLANT
SPONGE LAP 4X18 X RAY DECT (DISPOSABLE) ×5 IMPLANT
SPONGE TONSIL 1 RF SGL (DISPOSABLE) ×10 IMPLANT
STAPLER VISISTAT 35W (STAPLE) IMPLANT
STRIP CLOSURE SKIN 1/2X4 (GAUZE/BANDAGES/DRESSINGS) IMPLANT
SUT SILK  1 MH (SUTURE) ×4
SUT SILK 1 MH (SUTURE) ×6 IMPLANT
SUT SILK 2 0 SH CR/8 (SUTURE) ×5 IMPLANT
SUT VIC AB 1 CTX 18 (SUTURE) ×10 IMPLANT
SUT VIC AB 1 CTX 27 (SUTURE) IMPLANT
SUT VIC AB 1 CTX 36 (SUTURE) ×2
SUT VIC AB 1 CTX36XBRD ANBCTR (SUTURE) ×3 IMPLANT
SUT VIC AB 2-0 CT1 27 (SUTURE)
SUT VIC AB 2-0 CT1 TAPERPNT 27 (SUTURE) IMPLANT
SUT VIC AB 2-0 CTX 36 (SUTURE) ×5 IMPLANT
SUT VIC AB 3-0 SH 27 (SUTURE) ×2
SUT VIC AB 3-0 SH 27XBRD (SUTURE) ×3 IMPLANT
SUT VIC AB 3-0 X1 27 (SUTURE) ×10 IMPLANT
SUT VICRYL 2 TP 1 (SUTURE) ×5 IMPLANT
SWAB COLLECTION DEVICE MRSA (MISCELLANEOUS) IMPLANT
SYR 50ML SLIP (SYRINGE) IMPLANT
SYRINGE 10CC LL (SYRINGE) ×5 IMPLANT
SYSTEM SAHARA CHEST DRAIN ATS (WOUND CARE) ×10 IMPLANT
SYSTEM SAHARA CHEST DRAIN RE-I (WOUND CARE) IMPLANT
TAPE CLOTH SURG 4X10 WHT LF (GAUZE/BANDAGES/DRESSINGS) ×10 IMPLANT
TOWEL OR 17X24 6PK STRL BLUE (TOWEL DISPOSABLE) ×5 IMPLANT
TOWEL OR 17X26 10 PK STRL BLUE (TOWEL DISPOSABLE) ×15 IMPLANT
TRAP SPECIMEN MUCOUS 40CC (MISCELLANEOUS) ×10 IMPLANT
TRAY FOLEY CATH 14FRSI W/METER (CATHETERS) IMPLANT
TRAY FOLEY IC TEMP SENS 14FR (CATHETERS) ×5 IMPLANT
TUBE ANAEROBIC SPECIMEN COL (MISCELLANEOUS) IMPLANT
WATER STERILE IRR 1000ML POUR (IV SOLUTION) ×15 IMPLANT

## 2014-02-12 NOTE — Anesthesia Preprocedure Evaluation (Addendum)
Anesthesia Evaluation  Patient identified by MRN, date of birth, ID band Patient awake    Reviewed: Allergy & Precautions, H&P , NPO status , Patient's Chart, lab work & pertinent test results  Airway Mallampati: II      Dental   Pulmonary    + decreased breath sounds      Cardiovascular + angina Rhythm:Regular Rate:Normal  History noted. CE   Neuro/Psych    GI/Hepatic negative GI ROS, Neg liver ROS,   Endo/Other    Renal/GU negative Renal ROS     Musculoskeletal   Abdominal   Peds  Hematology   Anesthesia Other Findings   Reproductive/Obstetrics                          Anesthesia Physical Anesthesia Plan  ASA: III  Anesthesia Plan:    Post-op Pain Management:    Induction: Intravenous  Airway Management Planned: Double Lumen EBT  Additional Equipment: Arterial line and CVP  Intra-op Plan:   Post-operative Plan:   Informed Consent: I have reviewed the patients History and Physical, chart, labs and discussed the procedure including the risks, benefits and alternatives for the proposed anesthesia with the patient or authorized representative who has indicated his/her understanding and acceptance.   Dental advisory given  Plan Discussed with: Anesthesiologist and CRNA  Anesthesia Plan Comments:        Anesthesia Quick Evaluation

## 2014-02-12 NOTE — Progress Notes (Signed)
The patient was examined and preop studies reviewed. There has been no change from the prior exam and the patient is ready for surgery.  plan pericardial window and Left Lula Olszewskihamberlain - bx on S Clark-Fuller today

## 2014-02-12 NOTE — Progress Notes (Signed)
Patient ID: Sharon MantleShelby Clark-Fuller, female   DOB: 08-Jan-1961, 53 y.o.   MRN: [id number] EVENING ROUNDS NOTE :     [address]       [place] [id number]             [phone]                 Day of Surgery Procedure(s) (LRB): SUBXYPHOID PERICARDIAL WINDOW (N/A) MINI/LIMITED THORACOTOMY, Biopsy of Medialstinal lymph nodes (Left)  Total Length of Stay:  LOS: 3 days  BP 150/84  Pulse 110  Temp(Src) 99.2 F (37.3 C) (Oral)  Resp 25  Ht 5\' 3"  (1.6 m)  Wt 154 lb 5.2 oz (70 kg)  BMI 27.34 kg/m2  SpO2 97%  LMP 02/07/2014  .Intake/Output     10/07 0701 - 10/08 0700 10/08 0701 - 10/09 0700   P.O.  240   I.V. (mL/kg) 2057.2 (29.4) 3000 (42.9)   Total Intake(mL/kg) 2057.2 (29.4) 3240 (46.3)   Urine (mL/kg/hr)  2460 (3)   Other  250 (0.3)   Blood  200 (0.2)   Chest Tube  292 (0.4)   Total Output   3202   Net +2057.2 +38        Urine Occurrence 1 x      . dextrose 5 % and 0.45 % NaCl with KCl 20 mEq/L 100 mL/hr at 02/12/14 1232     Lab Results  Component Value Date   WBC 10.0 02/10/2014   HGB 11.3* 02/10/2014   HCT 34.0* 02/10/2014   PLT 235 02/10/2014   GLUCOSE 125* 02/10/2014   CHOL 114 02/10/2014   TRIG 38 02/10/2014   HDL 37* 02/10/2014   LDLCALC 69 02/10/2014   NA 142 02/10/2014   K 3.8 02/10/2014   CL 105 02/10/2014   CREATININE 0.63 02/10/2014   BUN 9 02/10/2014   CO2 26 02/10/2014   TSH 2.860 02/10/2014   INR 1.54* 02/11/2014   HGBA1C 5.8* 02/10/2014   Stable post op Extubated and without respiratory difficulty on 6 l [place]  Delight OvensEdward B Shivali Quackenbush MD  Beeper [phone] Office [phone] 02/12/2014 6:55 PM

## 2014-02-12 NOTE — Anesthesia Postprocedure Evaluation (Signed)
  Anesthesia Post-op Note  Patient: Sharon MantleShelby Clark-Fuller  Procedure(s) Performed: Procedure(s): SUBXYPHOID PERICARDIAL WINDOW (N/A) MINI/LIMITED THORACOTOMY, Biopsy of Medialstinal lymph nodes (Left)  Patient Location: PACU  Anesthesia Type:General  Level of Consciousness: awake  Airway and Oxygen Therapy: Patient Spontanous Breathing  Post-op Pain: mild  Post-op Assessment: Post-op Vital signs reviewed  Post-op Vital Signs: Reviewed  Last Vitals:  Filed Vitals:   02/12/14 1156  BP:   Pulse: 104  Temp: 36.7 C  Resp: 23    Complications: No apparent anesthesia complications

## 2014-02-12 NOTE — Progress Notes (Signed)
Family Medicine Teaching Service Social Note Intern Pager: [phone]  Patient name: Sharon MantleShelby Case Medical record number: [id number] Date of birth: 1961/03/22 Age: 53 y.o. Gender: female  Patient s/p Pericardial window procedure today.  CTVS now primary team.  Will continue to follow along and be available to consult as needed.  PE: Gen: Appears in pain CV: Tachycardic, no murmurs Pulm: CTAB, no w/r/c  PLAN: Per primary team (CTVS)   Shirlee LatchAngela Kelsei Defino, MD, MPH PGY-1,  [facility] FPT[facility] team pager: [phone] (text pages welcome)

## 2014-02-12 NOTE — Anesthesia Procedure Notes (Addendum)
Procedure Name: Intubation Date/Time: 02/12/2014 7:59 AM Performed by: Margaree MackintoshYACOUB, Crystalyn Delia B Pre-anesthesia Checklist: Patient identified, Emergency Drugs available, Suction available, Patient being monitored and Timeout performed Patient Re-evaluated:Patient Re-evaluated prior to inductionOxygen Delivery Method: Circle system utilized Preoxygenation: Pre-oxygenation with 100% oxygen Intubation Type: IV induction Ventilation: Mask ventilation without difficulty and Oral airway inserted - appropriate to patient size Laryngoscope Size: Mac and 3 Grade View: Grade II Tube type: Oral Tube size: 8.0 mm Number of attempts: 1 Airway Equipment and Method: Stylet Placement Confirmation: ETT inserted through vocal cords under direct vision,  positive ETCO2 and breath sounds checked- equal and bilateral Secured at: 20 cm Tube secured with: Tape Dental Injury: Teeth and Oropharynx as per pre-operative assessment     RIJ CVP Dual Lumen:The patient was identified and consent obtained.  TO was performed, and full barrier precautions were used.  The skin was anesthetized with lidocaine.  Once the vein was located with the 22 ga. needle using ultrasound guidance , the wire was inserted into the vein.  The wire location was confirmed with ultrasound.  The tissue was dilated and the catheter was carefully inserted, then sutured in place. A dressing was applied. The patient tolerated the procedure well.   CE

## 2014-02-12 NOTE — Brief Op Note (Signed)
02/09/2014 - 02/12/2014  9:59 AM  PATIENT:  Sharon DavenportShelby Clark-Fuller  53 y.o. female  PRE-OPERATIVE DIAGNOSIS:  1. Mediastinal adenopathy 2. Pericardial effusion  POST-OPERATIVE DIAGNOSIS: 1. Mediastinal adenopathy 2. Pericardial effusion   PROCEDURE:  SUBXYPHOID PERICARDIAL WINDOW, LEFT ANTERIOR MINI THORACOTOMY, BIOPSY OF PERICARDIUM AND AP WINDOW LYMPH NODE  FINDINGS: Hemorrhagic fluid removed from pericardial space. Left pleural effusion drained.  SURGEON:  Surgeon(s) and Role:    * Kerin PernaPeter Van Trigt, MD - Primary  PHYSICIAN ASSISTANT: Doree Fudgeonielle Jashay Roddy PA-C  ANESTHESIA:   general  EBL:  Total I/O In: 1000 [I.V.:1000] Out: 1650 [Urine:1200; Other:250; Blood:200]  BLOOD ADMINISTERED:none  DRAINS: Bard drain placed in the pericardial space and 28 French straight chest tube placed in the left pleural space    SPECIMEN:  Source of Specimen:  Pericardial biopsy and pericardial fluid sent for cytology and culture. AP window lymph node biopsies sent for pathology. Left pleural fluid sent for cytology.  DISPOSITION OF SPECIMEN:  Pathology and culture  COUNTS CORRECT:  YES  DICTATION: .Dragon Dictation  PLAN OF CARE: Admit to inpatient   PATIENT DISPOSITION:  PACU - hemodynamically stable.   Delay start of Pharmacological VTE agent (>24hrs) due to surgical blood loss or risk of bleeding: yes

## 2014-02-12 NOTE — Transfer of Care (Signed)
Immediate Anesthesia Transfer of Care Note  Patient: Sharon MantleShelby Clark-Fuller  Procedure(s) Performed: Procedure(s): SUBXYPHOID PERICARDIAL WINDOW (N/A) MINI/LIMITED THORACOTOMY, Biopsy of Medialstinal lymph nodes (Left)  Patient Location: PACU  Anesthesia Type:General  Level of Consciousness: awake, alert  and oriented  Airway & Oxygen Therapy: Patient Spontanous Breathing and Patient connected to face mask oxygen  Post-op Assessment: Report given to PACU RN and Post -op Vital signs reviewed and stable  Post vital signs: Reviewed and stable  Complications: No apparent anesthesia complications

## 2014-02-12 NOTE — Op Note (Signed)
NAMERAINI, TILEY         ACCOUNT NO.:  [id number]  MEDICAL RECORD NO.:  [id number]  LOCATION:  2S05C                        FACILITY:  [facility]  PHYSICIAN:  Kerin Perna, M.D.  DATE OF BIRTH:  [birth year]  DATE OF PROCEDURE:  02/12/2014 DATE OF DISCHARGE:                              OPERATIVE REPORT   OPERATION: 1. Subxiphoid pericardial window, drainage of pericardial effusion. 2. Left anterior mediastinotomy, mini thoracotomy, with biopsy of     aortopulmonary window lymph nodes.  PREOPERATIVE DIAGNOSIS:  Pericarditis with large pericardial effusion, mediastinal adenopathy.  POSTOPERATIVE DIAGNOSIS:  Pericarditis with large pericardial effusion, mediastinal adenopathy.  SURGEON:  Kerin Perna, MD  ASSISTANT:  Doree Fudge, PA  ANESTHESIA:  General by Dr. Judie Petit.  INDICATIONS:  The patient is a very nice 53 year old female who was admitted with atypical chest pain, negative cardiac enzymes, and found to have a moderate-to-large size pericardial effusion without evidence of tamponade.  CT scan ruled out PE but demonstrated mediastinal adenopathy, the most significant adenopathy located in the AP window area.  There were no discrete pulmonary nodules.  The patient was evaluated by Critical Care and felt that the patient could have an inflammatory disease such as sarcoidosis but malignancy needed to be ruled out with biopsies.  Plans were made for a pericardial window with biopsy of the pericardium as well as a biopsy of the mediastinal lymph nodes via a Chamberlain procedure.  I discussed the procedure in detail with the patient including the indications, benefits, alternatives, risks, and expected postoperative recovery.  I reviewed the major aspects of the operation including location of the surgical incisions, use of postoperative drainage tubes, and expected postoperative recovery.  I discussed with the patient risks including risks  of bleeding, pneumothorax, recurrent effusions, infection.  She demonstrated her understanding and agreed to proceed with surgery under what I felt was an informed consent.  OPERATIVE PROCEDURE:  The patient was brought to the operating room and placed supine on the operating table.  The chest and abdomen were prepped and draped as a sterile field from the neck to the umbilicus.  A proper time-out was performed.  A small incision was made based over the xiphoid and carried down through the rectus sheath.  Retractor was placed.  The xiphoid was excised.  A sternal elevating retractor was then placed and the tissue under the sternum was dissected to expose the pericardium.  An incision was made in the pericardium.  Immediate outflow of bloody fluid was noted.  This was sent for cultures and cytology.  The pericardium was thickened.  A 3 x 3 cm circular window was created by excising the pericardial tissue.  This was sent for frozen section which was negative for malignancy as well as permanent section and for cultures.  400 mL of fluid was drained.  The pericardial space was irrigated with sterile saline.  The surface of the heart was inspected and was found to have a fibrinous exudative membrane with edema and inflammation.  A soft Bard drain was placed in the dependent portion of the pericardial space and brought out through separate incision and secured to the skin.  The retractor was removed.  The fascia was closed  with interrupted #1 Vicryl.  The subcutaneous and skin were closed with running Vicryl.  A sterile Dermabond dressing was applied.  A second time-out was made [facility] the [facility].  A small incision was made in the third interspace to the left of the sternum.  A miniature retractor was used to gently spread the ribs.  Ventilation was held as the mediastinum was explored.  There was adenopathy in the AP window.  An incision was made in the mediastinal pleura  and the lymph node tissue was biopsied using the VATS bioptome.  A specimen for frozen section was sent which showed no evidence of malignancy.  Bleeding was controlled with electrocautery and topical measures.  A 28-French chest tube was placed in the pleural space and brought up through a separate incision.  It was secured to the skin with silk suture.  Both chest tubes were placed under water Pleur-evac system for drainage.  The left VATS incision was closed with interrupted #1 Vicryl for the fascia.  A running 2-0 Vicryl for the subcutaneous and skin was placed. A Dermabond skin dressing was applied.  The patient was then extubated and returned to the recovery room after x- ray showed no evidence of postoperative pneumothorax.     Kerin PernaPeter Van Trigt, M.D.     PV/MEDQ  D:  02/12/2014  T:  02/12/2014  Job:  [id number]

## 2014-02-13 ENCOUNTER — Encounter (HOSPITAL_COMMUNITY): Payer: Self-pay | Admitting: Cardiothoracic Surgery

## 2014-02-13 ENCOUNTER — Inpatient Hospital Stay (HOSPITAL_COMMUNITY): Payer: BC Managed Care – PPO

## 2014-02-13 LAB — CBC
HCT: 32.4 % — ABNORMAL LOW (ref 36.0–46.0)
Hemoglobin: 10.9 g/dL — ABNORMAL LOW (ref 12.0–15.0)
MCH: 28.5 pg (ref 26.0–34.0)
MCHC: 33.6 g/dL (ref 30.0–36.0)
MCV: 84.6 fL (ref 78.0–100.0)
Platelets: 258 10*3/uL (ref 150–400)
RBC: 3.83 MIL/uL — AB (ref 3.87–5.11)
RDW: 13.2 % (ref 11.5–15.5)
WBC: 11.9 10*3/uL — ABNORMAL HIGH (ref 4.0–10.5)

## 2014-02-13 LAB — BLOOD GAS, ARTERIAL
ACID-BASE EXCESS: 3.3 mmol/L — AB (ref 0.0–2.0)
Bicarbonate: 27.1 mEq/L — ABNORMAL HIGH (ref 20.0–24.0)
DRAWN BY: 369891
FIO2: 0.44 %
O2 SAT: 98.5 %
PATIENT TEMPERATURE: 98.6
TCO2: 28.4 mmol/L (ref 0–100)
pCO2 arterial: 39.8 mmHg (ref 35.0–45.0)
pH, Arterial: 7.449 (ref 7.350–7.450)
pO2, Arterial: 106 mmHg — ABNORMAL HIGH (ref 80.0–100.0)

## 2014-02-13 LAB — BASIC METABOLIC PANEL
Anion gap: 10 (ref 5–15)
BUN: 5 mg/dL — AB (ref 6–23)
CALCIUM: 8.1 mg/dL — AB (ref 8.4–10.5)
CO2: 26 mEq/L (ref 19–32)
Chloride: 105 mEq/L (ref 96–112)
Creatinine, Ser: 0.5 mg/dL (ref 0.50–1.10)
GFR calc Af Amer: >90 mL/min (ref >90)
GFR calc non Af Amer: >90 mL/min (ref >90)
GLUCOSE: 150 mg/dL — AB (ref 70–99)
Potassium: 3.8 mEq/L (ref 3.7–5.3)
Sodium: 141 mEq/L (ref 137–147)

## 2014-02-13 LAB — GLUCOSE, CAPILLARY
Glucose-Capillary: 122 mg/dL — ABNORMAL HIGH (ref 70–99)
Glucose-Capillary: 143 mg/dL — ABNORMAL HIGH (ref 70–99)
Glucose-Capillary: 85 mg/dL (ref 70–99)

## 2014-02-13 MED ORDER — FUROSEMIDE 10 MG/ML IJ SOLN
20.0000 mg | Freq: Two times a day (BID) | INTRAMUSCULAR | Status: DC
  Administered 2014-02-13 – 2014-02-17 (×10): 20 mg via INTRAVENOUS
  Filled 2014-02-13 (×17): qty 2

## 2014-02-13 MED ORDER — POTASSIUM CHLORIDE 10 MEQ/50ML IV SOLN
10.0000 meq | INTRAVENOUS | Status: AC
  Administered 2014-02-13 (×2): 10 meq via INTRAVENOUS
  Filled 2014-02-13 (×2): qty 50

## 2014-02-13 NOTE — Plan of Care (Signed)
Problem: Phase II Progression Outcomes Goal: If Diabetic, blood sugar < 150 Outcome: Completed/Met Date Met:  02/13/14 Not diabetic; and BG has been consistently below 150.

## 2014-02-13 NOTE — Progress Notes (Signed)
Family Medicine Teaching Service Social Note  Patient name: Sharon Case Medical record number: [id number] Date of birth: [birth year] Age: 53 y.o. Gender: female  Patient s/p Pericardial window procedure 10/8.  Patient feeling much improved and sitting in bedside chair today.  PE: Gen: NAD CV: Mildly tachycardic, no murmurs Pulm: CTAB, no w/r/c  Assessment: HD stable, on 3L [place], chest tube in place.  PLAN: Per primary team (CTVS)  - f/u OR biopsy, cultures - Will try to find PCP for this unassigned patient.   Shirlee LatchAngela Bacigalupo, MD, MPH PGY-1,  [place] [facility] FPTS team pager: [phone] (text pages welcome) - please page CTVS (primary team) first with any questions

## 2014-02-13 NOTE — Progress Notes (Signed)
POD # 1 Subxiphoid window/ Chamberlain  Resting comfortably  BP 133/83  Pulse 92  Temp(Src) 97.9 F (36.6 C) (Oral)  Resp 23  Ht 5\' 7"  (1.702 m)  Wt 154 lb 5.2 oz (70 kg)  BMI 27.34 kg/m2  SpO2 94%  LMP 02/07/2014   Intake/Output Summary (Last 24 hours) at 02/13/14 1806 Last data filed at 02/13/14 1509  Gross per 24 hour  Intake 3542.33 ml  Output   4269 ml  Net -726.67 ml    Doing well

## 2014-02-13 NOTE — Progress Notes (Signed)
ABG    Component Value Date/Time   PHART 7.449 02/13/2014 0437   PCO2ART 39.8 02/13/2014 0437   PO2ART 106.0* 02/13/2014 0437   HCO3 27.1* 02/13/2014 0437   TCO2 28.4 02/13/2014 0437   ACIDBASEDEF 1.2 02/11/2014 0500   O2SAT 98.5 02/13/2014 0437

## 2014-02-13 NOTE — Plan of Care (Signed)
Problem: Phase II Progression Outcomes Goal: Incision intact & without signs/symptoms of infection Outcome: Completed/Met Date Met:  02/13/14 Incisions open to air, good healing, no signs of infection noted.

## 2014-02-13 NOTE — Progress Notes (Addendum)
      [address]       [place] [id number]             [phone]      1 Day Post-Op Procedure(s) (LRB): SUBXYPHOID PERICARDIAL WINDOW (N/A) MINI/LIMITED THORACOTOMY, Biopsy of Medialstinal lymph nodes (Left)  Subjective:  Patient states she is feeling better this morning.  Her pain is well controlled with use of PCA.    Objective: Vital signs in last 24 hours: Temp:  [97.8 F (36.6 C)-99.4 F (37.4 C)] 97.9 F (36.6 C) (10/09 0400) Pulse Rate:  [74-113] 89 (10/09 0700) Cardiac Rhythm:  [-] Normal sinus rhythm (10/09 0500) Resp:  [16-36] 22 (10/09 0751) BP: (122-159)/(72-96) 152/84 mmHg (10/09 0700) SpO2:  [92 %-100 %] 96 % (10/09 0751) Arterial Line BP: (132-188)/(62-84) 147/68 mmHg (10/09 0700)  Intake/Output from previous day: 10/08 0701 - 10/09 0700 In: 4590 [P.O.:240; I.V.:4300; IV Piggyback:50] Out: 9147 [[id number] [Urine:4635; Blood:200; Chest Tube:426]  General appearance: alert, cooperative and no distress Heart: regular rate and rhythm Lungs: diminished breath sounds bibasilar Abdomen: soft, non-tender; bowel sounds normal; no masses,  no organomegaly Extremities: extremities normal, atraumatic, no cyanosis or edema Wound: clean and dry  Lab Results:  Recent Labs  02/13/14 0420  WBC 11.9*  HGB 10.9*  HCT 32.4*  PLT 258   BMET:  Recent Labs  02/13/14 0420  NA 141  K 3.8  CL 105  CO2 26  GLUCOSE 150*  BUN 5*  CREATININE 0.50  CALCIUM 8.1*    PT/INR:  Recent Labs  02/11/14 0528  LABPROT 18.5*  INR 1.54*   ABG    Component Value Date/Time   PHART 7.449 02/13/2014 0437   HCO3 27.1* 02/13/2014 0437   TCO2 28.4 02/13/2014 0437   ACIDBASEDEF 1.2 02/11/2014 0500   O2SAT 98.5 02/13/2014 0437   CBG (last 3)   Recent Labs  02/12/14 1733 02/13/14 0001 02/13/14 0630  GLUCAP 165* 143* 122*    Assessment/Plan: S/P Procedure(s) (LRB): SUBXYPHOID PERICARDIAL WINDOW (N/A) MINI/LIMITED THORACOTOMY, Biopsy of Medialstinal lymph  nodes (Left)  1.  CV- Hemodynamically stable 2. Pulm- wean oxygen as tolerated, small bilateral pleural effusions, encouraged use of IS 3. Renal- Creatinine, lytes okay- no Lasix at this time 4. DVT prophylaxis- continue SCDs, will hold Lovenox due to Hemorrhagic pericarditis 5. D/C Arterial Line 6. D/C Foley in ambulating 7. Dispo- patient stable, leave chest tubes today, OR cultures, pathology pending   LOS: 4 days    BARRETT, ERIN 02/13/2014  Pericardial path-- inflammation AP window node-- non caseating granuloma c/w sarcoidosis Dc L chest tube tomorrow patient examined and medical record reviewed,agree with above note. VAN TRIGT III,Garald Rhew 02/13/2014

## 2014-02-13 NOTE — Plan of Care (Signed)
Problem: Phase II Progression Outcomes Goal: Tolerates progressive ambulation Outcome: Completed/Met Date Met:  02/13/14 Did great walking today; no complaints of pain, SOB, or difficulties with walking.

## 2014-02-13 NOTE — Progress Notes (Signed)
   PCCM will sign off  OPD fu with Dr Marchelle Gearingamaswamy made: Friday 02/20/14 9.30am  In interim if dx of sarcoid confirmed: start prednisnoe 40mg  per day   Future Appointments Date Time Provider Department Center  02/20/2014 9:30 AM Kalman ShanMurali Fulton Merry, MD [facility] None     Dr. Kalman ShanMurali Trinitie Mcgirr, M.D., [facility] Pulmonary and Critical Care Medicine Staff Physician [facility] [place] [facility] Pager: [phone], If no answer or between  15:00h - 7:00h: call [phone]  02/13/2014 11:25 AM

## 2014-02-14 ENCOUNTER — Inpatient Hospital Stay (HOSPITAL_COMMUNITY): Payer: BC Managed Care – PPO

## 2014-02-14 LAB — COMPREHENSIVE METABOLIC PANEL
ALBUMIN: 2.2 g/dL — AB (ref 3.5–5.2)
ALK PHOS: 80 U/L (ref 39–117)
ALT: 26 U/L (ref 0–35)
AST: 15 U/L (ref 0–37)
Anion gap: 10 (ref 5–15)
BUN: 6 mg/dL (ref 6–23)
CO2: 29 mEq/L (ref 19–32)
Calcium: 8.2 mg/dL — ABNORMAL LOW (ref 8.4–10.5)
Chloride: 99 mEq/L (ref 96–112)
Creatinine, Ser: 0.57 mg/dL (ref 0.50–1.10)
GFR calc Af Amer: >90 mL/min (ref >90)
GFR calc non Af Amer: >90 mL/min (ref >90)
Glucose, Bld: 94 mg/dL (ref 70–99)
POTASSIUM: 3.6 meq/L — AB (ref 3.7–5.3)
SODIUM: 138 meq/L (ref 137–147)
Total Bilirubin: 0.5 mg/dL (ref 0.3–1.2)
Total Protein: 6.5 g/dL (ref 6.0–8.3)

## 2014-02-14 LAB — CBC
HCT: 35.5 % — ABNORMAL LOW (ref 36.0–46.0)
Hemoglobin: 11.8 g/dL — ABNORMAL LOW (ref 12.0–15.0)
MCH: 28.5 pg (ref 26.0–34.0)
MCHC: 33.2 g/dL (ref 30.0–36.0)
MCV: 85.7 fL (ref 78.0–100.0)
PLATELETS: 288 10*3/uL (ref 150–400)
RBC: 4.14 MIL/uL (ref 3.87–5.11)
RDW: 13.6 % (ref 11.5–15.5)
WBC: 10.9 10*3/uL — AB (ref 4.0–10.5)

## 2014-02-14 LAB — GLUCOSE, CAPILLARY
GLUCOSE-CAPILLARY: 105 mg/dL — AB (ref 70–99)
Glucose-Capillary: 114 mg/dL — ABNORMAL HIGH (ref 70–99)
Glucose-Capillary: 91 mg/dL (ref 70–99)

## 2014-02-14 MED ORDER — SODIUM CHLORIDE 0.9 % IJ SOLN
10.0000 mL | Freq: Two times a day (BID) | INTRAMUSCULAR | Status: DC
  Administered 2014-02-14 – 2014-02-16 (×6): 10 mL

## 2014-02-14 MED ORDER — SODIUM CHLORIDE 0.9 % IJ SOLN
10.0000 mL | INTRAMUSCULAR | Status: DC | PRN
  Administered 2014-02-17: 10 mL

## 2014-02-14 MED ORDER — POTASSIUM CHLORIDE 10 MEQ/50ML IV SOLN
10.0000 meq | INTRAVENOUS | Status: AC
  Administered 2014-02-14 (×3): 10 meq via INTRAVENOUS
  Filled 2014-02-14 (×2): qty 50

## 2014-02-14 MED ORDER — PROMETHAZINE HCL 25 MG/ML IJ SOLN
12.5000 mg | Freq: Four times a day (QID) | INTRAMUSCULAR | Status: DC | PRN
  Administered 2014-02-14 – 2014-02-15 (×3): 12.5 mg via INTRAVENOUS
  Filled 2014-02-14 (×4): qty 1

## 2014-02-14 NOTE — Progress Notes (Addendum)
Family Medicine Teaching Service Social Note  Patient name: Sharon Case Medical record number: [id number] Date of birth: Katherine Mantle1962-12-12 Age: 53 y.o. Gender: female  Patient s/p Pericardial window procedure 10/8.  Notes not feeling as well today. States just told by surgeon that there was more fluid around her lungs.  PE: Gen: NAD, sitting up in chair at bedside CV: rrr, no murmurs Pulm: CTAB anteriorly, no w/r/c  Assessment: Patient with pericarditis s/p subxyphoid pericardial window. HD stable, on 2L [facility], chest tube in place.  PLAN: Per primary team (CTVS)  - f/u cultures - Will arrange for PCP follow-up at discharge   Glori LuisEric G Ricca Melgarejo, MD PGY-3,  [facility] FPTS team pager: [phone] (text pages welcome) - please page CTVS (primary team) first with any questions

## 2014-02-14 NOTE — Progress Notes (Signed)
2 Days Post-Op Procedure(s) (LRB): SUBXYPHOID PERICARDIAL WINDOW (N/A) MINI/LIMITED THORACOTOMY, Biopsy of Medialstinal lymph nodes (Left) Subjective: C/o nausea and vomiting this AM    Objective: Vital signs in last 24 hours: Temp:  [97.9 F (36.6 C)-98.3 F (36.8 C)] 98.2 F (36.8 C) (10/10 0800) Pulse Rate:  [89-105] 100 (10/10 0800) Cardiac Rhythm:  [-] Normal sinus rhythm;Sinus tachycardia (10/10 0721) Resp:  [14-26] 16 (10/10 0800) BP: (108-152)/(71-92) 112/79 mmHg (10/10 0800) SpO2:  [94 %-99 %] 96 % (10/10 0800) Arterial Line BP: (146)/(71) 146/71 mmHg (10/09 0900)  Hemodynamic parameters for last 24 hours:    Intake/Output from previous day: 10/09 0701 - 10/10 0700 In: 2598.3 [P.O.:2160; I.V.:335.3; IV Piggyback:50] Out: 4360 [Urine:4160; Chest Tube:200] Intake/Output this shift: Total I/O In: 100 [IV Piggyback:100] Out: 60 [Urine:60]  General appearance: alert and no distress Neurologic: intact Heart: regular rate and rhythm Lungs: diminished breath sounds bibasilar Abdomen: normal findings: soft, non-tender and good BS no air leak  Lab Results:  Recent Labs  02/13/14 0420 02/14/14 0434  WBC 11.9* 10.9*  HGB 10.9* 11.8*  HCT 32.4* 35.5*  PLT 258 288   BMET:  Recent Labs  02/13/14 0420 02/14/14 0434  NA 141 138  K 3.8 3.6*  CL 105 99  CO2 26 29  GLUCOSE 150* 94  BUN 5* 6  CREATININE 0.50 0.57  CALCIUM 8.1* 8.2*    PT/INR: No results found for this basename: LABPROT, INR,  in the last 72 hours ABG    Component Value Date/Time   PHART 7.449 02/13/2014 0437   HCO3 27.1* 02/13/2014 0437   TCO2 28.4 02/13/2014 0437   ACIDBASEDEF 1.2 02/11/2014 0500   O2SAT 98.5 02/13/2014 0437   CBG (last 3)   Recent Labs  02/13/14 0630 02/13/14 1150 02/14/14 0009  GLUCAP 122* 85 114*    Assessment/Plan: S/P Procedure(s) (LRB): SUBXYPHOID PERICARDIAL WINDOW (N/A) MINI/LIMITED THORACOTOMY, Biopsy of Medialstinal lymph nodes (Left) - Slow  progress  N/V this Am- she has good BS and abdomen benign  Minimal drainage from left pleural tube but her CXR shows increased bilateral pleural effusions- will leave tube today  Moderate drainage from pericardium- keep drain for now  Only getting 500 ml with IS- needs to improve, will add flutter valve  SCD for DVT prophylaxis, no enoxaparin due to hemorrhagic pericarditis  On colchicine for pericarditis   LOS: 5 days    Vayda Dungee C 02/14/2014

## 2014-02-14 NOTE — Progress Notes (Signed)
Stable day  BP 117/78  Pulse 96  Temp(Src) 97.8 F (36.6 C) (Oral)  Resp 22  Ht 5\' 7"  (1.702 m)  Wt 154 lb 5.2 oz (70 kg)  BMI 27.34 kg/m2  SpO2 96%  LMP 02/07/2014   Intake/Output Summary (Last 24 hours) at 02/14/14 1819 Last data filed at 02/14/14 1800  Gross per 24 hour  Intake   1410 ml  Output   4530 ml  Net  -3120 ml    Continues to diurese

## 2014-02-15 ENCOUNTER — Inpatient Hospital Stay (HOSPITAL_COMMUNITY): Payer: BC Managed Care – PPO

## 2014-02-15 LAB — BASIC METABOLIC PANEL
ANION GAP: 9 (ref 5–15)
BUN: 10 mg/dL (ref 6–23)
CHLORIDE: 97 meq/L (ref 96–112)
CO2: 30 mEq/L (ref 19–32)
CREATININE: 0.57 mg/dL (ref 0.50–1.10)
Calcium: 8.4 mg/dL (ref 8.4–10.5)
GFR calc non Af Amer: >90 mL/min (ref >90)
Glucose, Bld: 96 mg/dL (ref 70–99)
Potassium: 3.7 mEq/L (ref 3.7–5.3)
Sodium: 136 mEq/L — ABNORMAL LOW (ref 137–147)

## 2014-02-15 LAB — BODY FLUID CULTURE: Culture: NO GROWTH

## 2014-02-15 LAB — TISSUE CULTURE
Culture: NO GROWTH
Gram Stain: NONE SEEN

## 2014-02-15 LAB — CBC
HCT: 37.5 % (ref 36.0–46.0)
Hemoglobin: 12.4 g/dL (ref 12.0–15.0)
MCH: 28.4 pg (ref 26.0–34.0)
MCHC: 33.1 g/dL (ref 30.0–36.0)
MCV: 85.8 fL (ref 78.0–100.0)
PLATELETS: 303 10*3/uL (ref 150–400)
RBC: 4.37 MIL/uL (ref 3.87–5.11)
RDW: 13.6 % (ref 11.5–15.5)
WBC: 9.4 10*3/uL (ref 4.0–10.5)

## 2014-02-15 MED ORDER — VANCOMYCIN HCL IN DEXTROSE 750-5 MG/150ML-% IV SOLN
750.0000 mg | Freq: Two times a day (BID) | INTRAVENOUS | Status: DC
  Administered 2014-02-15 – 2014-02-18 (×6): 750 mg via INTRAVENOUS
  Filled 2014-02-15 (×7): qty 150

## 2014-02-15 MED ORDER — LUNG SURGERY BOOK
Freq: Once | Status: AC
  Administered 2014-02-15: 23:00:00
  Filled 2014-02-15: qty 1

## 2014-02-15 MED ORDER — DIPHENHYDRAMINE HCL 12.5 MG/5ML PO ELIX
12.5000 mg | ORAL_SOLUTION | Freq: Three times a day (TID) | ORAL | Status: DC | PRN
Start: 2014-02-15 — End: 2014-02-18
  Administered 2014-02-15: 12.5 mg via ORAL
  Filled 2014-02-15: qty 5

## 2014-02-15 NOTE — Progress Notes (Signed)
Pt reported pain in left upper arm; On assessment redness, swelling, and painful to the touch.  Dr. Dorris FetchHendrickson paged about findings, en route to bedside, orders to follow.

## 2014-02-15 NOTE — Progress Notes (Signed)
ANTIBIOTIC CONSULT NOTE - INITIAL  Pharmacy Consult for Vancomycin Indication: Cellulitis  Allergies  Allergen Reactions  . Sulfa Antibiotics Itching and Rash    Patient Measurements: Height: 5\' 7"  (170.2 cm) Weight: 154 lb 12.2 oz (70.2 kg) IBW/kg (Calculated) : 61.6 Adjusted Body Weight:    Vital Signs: Temp: 98.1 F (36.7 C) (10/11 0700) Temp Source: Oral (10/11 0700) BP: 111/71 mmHg (10/11 1000) Pulse Rate: 103 (10/11 1000) Intake/Output from previous day: 10/10 0701 - 10/11 0700 In: 1680 [P.O.:1320; I.V.:210; IV Piggyback:150] Out: 3230 [Urine:3080; Chest Tube:150] Intake/Output from this shift: Total I/O In: 113.5 [I.V.:113.5] Out: 850 [Urine:700; Chest Tube:150]  Labs:  Recent Labs  02/13/14 0420 02/14/14 0434 02/15/14 0430  WBC 11.9* 10.9* 9.4  HGB 10.9* 11.8* 12.4  PLT 258 288 303  CREATININE 0.50 0.57 0.57   Estimated Creatinine Clearance: 79.1 ml/min (by C-G formula based on Cr of 0.57). No results found for this basename: VANCOTROUGH, Leodis BinetVANCOPEAK, VANCORANDOM, GENTTROUGH, GENTPEAK, GENTRANDOM, TOBRATROUGH, TOBRAPEAK, TOBRARND, AMIKACINPEAK, AMIKACINTROU, AMIKACIN,  in the last 72 hours   Microbiology: Recent Results (from the past 720 hour(s))  MRSA PCR SCREENING     Status: None   Collection Time    02/09/14  9:21 PM      Result Value Ref Range Status   MRSA by PCR NEGATIVE  NEGATIVE Final   Comment:            The GeneXpert MRSA Assay (FDA     approved for NASAL specimens     only), is one component of a     comprehensive MRSA colonization     surveillance program. It is not     intended to diagnose MRSA     infection nor to guide or     monitor treatment for     MRSA infections.  FUNGUS CULTURE W SMEAR     Status: None   Collection Time    02/12/14  8:30 AM      Result Value Ref Range Status   Specimen Description FLUID PERICARDIAL   Final   Special Requests NONE   Final   Fungal Smear     Final   Value: NO YEAST OR FUNGAL ELEMENTS  SEEN     Performed at [facility]   Culture     Final   Value: CULTURE IN PROGRESS FOR FOUR WEEKS     Performed at [facility]   Report Status PENDING   Incomplete  BODY FLUID CULTURE     Status: None   Collection Time    02/12/14  8:30 AM      Result Value Ref Range Status   Specimen Description FLUID PERICARDIAL   Final   Special Requests NONE   Final   Gram Stain     Final   Value: FEW WBC PRESENT,BOTH PMN AND MONONUCLEAR     NO ORGANISMS SEEN     Performed at [facility]   Culture     Final   Value: NO GROWTH 2 DAYS     Performed at [facility]   Report Status PENDING   Incomplete  AFB CULTURE WITH SMEAR     Status: None   Collection Time    02/12/14  8:30 AM      Result Value Ref Range Status   Specimen Description FLUID PERICARDIAL   Final   Special Requests NONE   Final   Acid Fast Smear     Final   Value:  NO ACID FAST BACILLI SEEN     Performed at [facility]   Culture     Final   Value: CULTURE WILL BE EXAMINED FOR 6 WEEKS BEFORE ISSUING A FINAL REPORT     Performed at [facility]   Report Status PENDING   Incomplete  AFB CULTURE WITH SMEAR     Status: None   Collection Time    02/12/14  8:51 AM      Result Value Ref Range Status   Specimen Description TISSUE PERICARDIAL   Final   Special Requests NONE   Final   Acid Fast Smear     Final   Value: NO ACID FAST BACILLI SEEN     Performed at [facility]   Culture     Final   Value: CULTURE WILL BE EXAMINED FOR 6 WEEKS BEFORE ISSUING A FINAL REPORT     Performed at [facility]   Report Status PENDING   Incomplete  FUNGUS CULTURE W SMEAR     Status: None   Collection Time    02/12/14  8:51 AM      Result Value Ref Range Status   Specimen Description TISSUE PERICARDIAL   Final   Special Requests NONE   Final   Fungal Smear     Final   Value: NO YEAST OR FUNGAL ELEMENTS SEEN     Performed at [facility]   Culture     Final    Value: CULTURE IN PROGRESS FOR FOUR WEEKS     Performed at [facility]   Report Status PENDING   Incomplete  TISSUE CULTURE     Status: None   Collection Time    02/12/14  8:51 AM      Result Value Ref Range Status   Specimen Description TISSUE PERICARDIAL   Final   Special Requests NONE   Final   Gram Stain     Final   Value: NO WBC SEEN     NO ORGANISMS SEEN     Performed at [facility]   Culture     Final   Value: NO GROWTH 3 DAYS     Performed at [facility]   Report Status 02/15/2014 FINAL   Final    Medical History: History reviewed. No pertinent past medical history.  Medications:  Prescriptions prior to admission  Medication Sig Dispense Refill  . Ascorbic Acid (VITAMIN C PO) Take 1 tablet by mouth daily.       . B Complex Vitamins (VITAMIN B COMPLEX PO) Take 1 tablet by mouth daily.        Assessment: Pericarditis S/p pericardial window 10/8.   ID: Pain and swelling of L upper arm. Also need to f/o DVT. WBC 9.4. Afebrile. Currently on Doxy 50mg  BID through 10/13. Start empiric Vancomycin for cellulitis.  Goal of Therapy:  Vanco trough 10-15  Plan:  Vancomycin 750mg  IV q12hrs Dopplers?   Amy Belloso S. Merilynn Finlandobertson, PharmD, BCPS Clinical Staff Pharmacist Pager [phone]  Misty Stanleyobertson, Rithy Mandley Stillinger 02/15/2014,11:36 AM

## 2014-02-15 NOTE — Progress Notes (Signed)
Patient ID: Sharon MantleShelby Case, female   DOB: [birth year], 53 y.o.   MRN: [id number]  CTSP re: pain and swelling left upper arm  She says she forgot to mention it this morning but her left arm (bicep area) is swollen and uncomfortable  On exam she does have swelling and erythema, also mildly tender. Hand N/V intact and no swelling distally  This looks clinically like a cellulitis, although a DVT is also a possibility.  Will treat with IV vanco and warm compresses and observe closely  I think risk of bleeding in setting of hemorrhagic pericarditis outweighs benefit of empiric blood thinners in this setting

## 2014-02-15 NOTE — Progress Notes (Signed)
3 Days Post-Op Procedure(s) (LRB): SUBXYPHOID PERICARDIAL WINDOW (N/A) MINI/LIMITED THORACOTOMY, Biopsy of Medialstinal lymph nodes (Left) Subjective: Feels better today  Objective: Vital signs in last 24 hours: Temp:  [97.4 F (36.3 C)-98.3 F (36.8 C)] 98.1 F (36.7 C) (10/11 0700) Pulse Rate:  [87-106] 87 (10/11 0700) Cardiac Rhythm:  [-] Normal sinus rhythm (10/11 0700) Resp:  [13-28] 13 (10/11 0700) BP: (108-149)/(66-86) 108/66 mmHg (10/11 0700) SpO2:  [95 %-100 %] 95 % (10/11 0700) Weight:  [154 lb 12.2 oz (70.2 kg)] 154 lb 12.2 oz (70.2 kg) (10/11 0600)  Hemodynamic parameters for last 24 hours:    Intake/Output from previous day: 10/10 0701 - 10/11 0700 In: 1680 [P.O.:1320; I.V.:210; IV Piggyback:150] Out: 3125 [Urine:3005; Chest Tube:120] Intake/Output this shift:    General appearance: alert and no distress Neurologic: intact Heart: regular rate and rhythm Lungs: diminished breath sounds bibasilar Wound: clean and dry no air leak  Lab Results:  Recent Labs  02/14/14 0434 02/15/14 0430  WBC 10.9* 9.4  HGB 11.8* 12.4  HCT 35.5* 37.5  PLT 288 303   BMET:  Recent Labs  02/14/14 0434 02/15/14 0430  NA 138 136*  K 3.6* 3.7  CL 99 97  CO2 29 30  GLUCOSE 94 96  BUN 6 10  CREATININE 0.57 0.57  CALCIUM 8.2* 8.4    PT/INR: No results found for this basename: LABPROT, INR,  in the last 72 hours ABG    Component Value Date/Time   PHART 7.449 02/13/2014 0437   HCO3 27.1* 02/13/2014 0437   TCO2 28.4 02/13/2014 0437   ACIDBASEDEF 1.2 02/11/2014 0500   O2SAT 98.5 02/13/2014 0437   CBG (last 3)   Recent Labs  02/13/14 1736 02/14/14 0009 02/14/14 0648  GLUCAP 91 114* 105*    Assessment/Plan: S/P Procedure(s) (LRB): SUBXYPHOID PERICARDIAL WINDOW (N/A) MINI/LIMITED THORACOTOMY, Biopsy of Medialstinal lymph nodes (Left) - CV- Stable, 50 ml out from pericardial drain overnight  Colchicine for pericarditis  RESP- bibasilar atelectasis  significantly better on CXR today  Continue IS, flutter  Dc chest tube  REANL- lytes and creatinine OK  CBG well controlled  Not using PCA- use PO pain meds, dc PCA  Continue ambulation   LOS: 6 days    Tomasina Keasling C 02/15/2014

## 2014-02-15 NOTE — Progress Notes (Signed)
Family Medicine Teaching Service Social Note  Patient name: Sharon MantleShelby Case Medical record number: [id number] Date of birth: [birth year] Age: 53 y.o. Gender: female  Patient s/p Pericardial window procedure 10/8.  Notes feeling improved today. Was able to get up and walk yesterday.  PE: Gen: NAD, sitting up in chair at bedside CV: rrr, no murmurs Pulm: CTAB anteriorly, no w/r/c  Assessment: Patient with pericarditis s/p subxyphoid pericardial window. HD stable, on 1L [place], chest tubes in place.  PLAN: Per primary team (CTVS) - appreciate care provided to patient by CTVS  - f/u cultures - Will arrange for PCP follow-up at discharge   Glori LuisEric G Sonnenberg, MD PGY-3,  [facility] FPTS team pager: [phone] (text pages welcome) - please page CTVS (primary team) first with any questions

## 2014-02-15 NOTE — Progress Notes (Signed)
Left arm about the same  BP 98/70  Pulse 104  Temp(Src) 98.3 F (36.8 C) (Oral)  Resp 25  Ht 5\' 7"  (1.702 m)  Wt 154 lb 12.2 oz (70.2 kg)  BMI 24.23 kg/m2  SpO2 93%  LMP 02/07/2014   Intake/Output Summary (Last 24 hours) at 02/15/14 1756 Last data filed at 02/15/14 1400  Gross per 24 hour  Intake  663.5 ml  Output   2600 ml  Net -1936.5 ml    L arm bicep area skin erythematous c/w cellulitis- Vanco started, follow

## 2014-02-16 ENCOUNTER — Inpatient Hospital Stay (HOSPITAL_COMMUNITY): Payer: BC Managed Care – PPO

## 2014-02-16 DIAGNOSIS — Z9889 Other specified postprocedural states: Secondary | ICD-10-CM

## 2014-02-16 DIAGNOSIS — R600 Localized edema: Secondary | ICD-10-CM

## 2014-02-16 LAB — TYPE AND SCREEN
ABO/RH(D): A POS
Antibody Screen: NEGATIVE
Unit division: 0
Unit division: 0

## 2014-02-16 MED ORDER — PREDNISONE 20 MG PO TABS
20.0000 mg | ORAL_TABLET | Freq: Every day | ORAL | Status: DC
  Administered 2014-02-16 – 2014-02-17 (×2): 20 mg via ORAL
  Filled 2014-02-16 (×4): qty 1

## 2014-02-16 NOTE — Progress Notes (Signed)
VASCULAR LAB PRELIMINARY  PRELIMINARY  PRELIMINARY  PRELIMINARY  Left upper extremity venous Doppler completed.    Preliminary report:  There is no DVT or SVT noted in the left upper extremity.   Lora Chavers, RVT 02/16/2014, 10:12 AM

## 2014-02-16 NOTE — Clinical Documentation Improvement (Signed)
  Please document the probable, suspected or known cause of the hemorrhagic pericarditis.  Thank You, Jerral Ralphathy R Reina Wilton ,RN Clinical Documentation Specialist [phone] [facility]- Health Information Management

## 2014-02-16 NOTE — Progress Notes (Signed)
1030 Transferred to 2W 10 via WC and monitor, placed in bed, O2 at 1L, SCD's with pt. NT in to receive patient.

## 2014-02-16 NOTE — Discharge Summary (Signed)
Physician Discharge Summary  Patient ID: Sharon Case-Fuller MRN: [id number] DOB/AGE: May 07, 1961 53 y.o.  Admit date: 02/09/2014 Discharge date: 02/18/2014  Admission Diagnoses:  Patient Active Problem List   Diagnosis Date Noted  . S/P lymph node biopsy 02/16/2014  . Mediastinal adenopathy 02/10/2014  . Unstable angina 02/09/2014  . Abnormal EKG 02/09/2014  . Pericardial effusion 02/09/2014  . Leukocytosis 02/09/2014   Discharge Diagnoses:   Patient Active Problem List   Diagnosis Date Noted  . S/P lymph node biopsy 02/16/2014  . Mediastinal adenopathy 02/10/2014  . Unstable angina 02/09/2014  . Abnormal EKG 02/09/2014  . Pericardial effusion 02/09/2014  . Leukocytosis 02/09/2014   Discharged Condition: good  History of Present Illness:   Sharon Case is a 53 yo African American Female who presented to the ED with a 2 week history of worsening left anterior and left shoulder pain.  This has been occuring for the past few months and has been associated with a 10 lb weight loss.  Workup in the ED revealed a moderate sized pericardial effusion without evidence of Tamponade.  Cardiac enzymes and EKG were negative.  CTA of the chest was also obtained and did not show evidence of Aortic Dissection, but there was abnormal adenopathy in the AP window as well as mild subcarinal adenopathy.      Hospital Course:   [facility].  Pulmonology evaluated the patient and had a high concern for sarcoidosis.  Therefore, TCTS was consulted for possible Mediastinoscopy and Sub Xiphoid Pericardial Window.  She was evaluated by Dr. Donata ClayVan Trigt on 02/10/2014 at which time he felt the patient's symptoms were likely related to pericarditis.  He recommended a sub xiphoid pericardial window for drainage of the effusion.  It was also felt she would benefit from a left sided Chamberlain procedure to obtain tissue for pathology.  The risks  and benefits of the procedure were explained to the patient and she was agreeable to proceed.  The patient was taken to the operating room on 02/12/2014.  She underwent Left Anterior Mini Thoracotomy with Biopsy of the AP window lymph node.  She also underwent Sub xiphoid Pericardial Window, with biopsy of the pericardium and drainage of pericardial fluid.  The patient tolerated the procedure well, was extubated, and taken to the PACU in stable condition.  The patient progressed well since surgery.  Her chest tube drainage decreased over several days and was later removed.  She was placed on Colchicine for Pericarditis.  She was weaned off oxygen as tolerated.  She developed some Left Upper extremity arm pain.  She was placed on Vancomycin for suspected cellulitis and Doppler study was ordered.  This showed no evidence of DVT.  She will be transitioned to oral antibiotics prior to discharge.  She is otherwise doing well.  She is ambulating without difficulty.  She is tolerating a regular diet.  Consults: cardiology and pulmonary/intensive care  Significant Diagnostic Studies: radiology:   CT scan:   1. No evidence for acute pulmonary embolus.  2. Moderate pericardial effusion.  3. Enlarged mediastinal lymph nodes are identified. This is a  nonspecific finding but may be seen with metastatic adenopathy, lymphoproliferative disorder as well as granulomatous inflammation or infection. Careful clinical correlation advise. 4. Lower lobe dependent changes and atelectasis. 5. Nonspecific right lower lobe pulmonary nodule measures 4 mm. If the patient is at high risk for bronchogenic carcinoma, follow-up chest CT at 1 year is recommended. If  the patient is at low risk, no follow-up is needed. This recommendation follows the consensus  statement: Guidelines for Management of Small Pulmonary Nodules Detected on CT Scans: A Statement from the Fleischner Society as published in Radiology 2005; 237:395-400.    Echocardiogram:   - Left ventricle: The cavity size was normal. Systolic function was normal. The estimated ejection fraction was in the range of 60%to 65%. Wall motion was normal; there were no regional wall motion abnormalities. - Right ventricle: The cavity size was mildly decreased. Wall thickness was normal. - Pericardium, extracardiac: The epigastric view shows a small RV cavity that appears for shortened. No evidence of RV collapse in the apical 4 chamber or parasternal views. A moderate, free-flowing pericardial effusion was identified circumferential to the heart. The fluid had no internal echoes.There was no evidence of hemodynamic compromise. There was mild right atrial chamber collapse for more than 50% of the cardiac cycle. Respirophasic change in stroke volume was normal. Features were not consistent with tamponade physiology.  Treatments: surgery:   1. Subxiphoid pericardial window, drainage of pericardial effusion.  2. Left anterior mediastinotomy, mini thoracotomy, with biopsy of  aortopulmonary window lymph nodes.   Pathology:   1. Pericardium, biopsy - INFLAMED FIBROUS TISSUE WITH ASSOCIATED FIBRINOUS MATERIAL. - THERE IS NO EVIDENCE OF MALIGNANCY. 2. Pericardium, biopsy - INFLAMED FIBROUS TISSUE WITH ASSOCIATED FIBRINOUS MATERIAL. - THERE IS NO EVIDENCE OF MALIGNANCY. 3. Lymph node, biopsy, AP Window - BENIGN LYMPH NODE WITH NON CASEATING GRANULOMATOUS INFLAMMATION. - SEE COMMENT. 4. Lymph node, biopsy, AP window - BENIGN LYMPH NODE WITH NON CASEATING GRANULOMATOUS INFLAMMATION. - SEE COMMENT. 5. Lymph node, biopsy, AP window - BENIGN LYMPH NODE WITH NON CASEATING GRANULOMATOUS INFLAMMATION. - SEE COMMENT. 6. Lymph node, biopsy, AP window - BENIGN LYMPH NODE WITH NON CASEATING GRANULOMATOUS INFLAMMATION.  Discharge Exam: Vital Signs: Blood pressure 142/78, pulse 79, temperature 98.2 F (36.8 C), temperature source Oral, resp. rate 18, height 5\' 7"  (1.702  m), weight 153 lb 7 oz (69.6 kg), last menstrual period 02/07/2014, SpO2 96.00%.   Physical Exam: Resp: clear to auscultation bilaterally Cardio: RRR, no murmur Extremities: no edema, redness or tenderness in the calves or thighs Wounds: Clean, dry, no signs of infection  Disposition: 01-Home or Self Care   Discharge Medications:    Medication List         azithromycin 250 MG tablet  Commonly known as:  ZITHROMAX  Please take Z pak as directed.     colchicine 0.6 MG tablet  Take 1 tablet (0.6 mg total) by mouth 2 (two) times daily. For 5 days thenn stop.     ibuprofen 600 MG tablet  Commonly known as:  ADVIL,MOTRIN  Take 1 tablet (600 mg total) by mouth 3 (three) times daily. For 5  days then stop.     metoprolol succinate 25 MG 24 hr tablet  Commonly known as:  TOPROL-XL  Take 1 tablet (25 mg total) by mouth daily.     oxyCODONE 5 MG immediate release tablet  Commonly known as:  Oxy IR/ROXICODONE  Take 1-2 tablets (5-10 mg total) by mouth every 4 (four) hours as needed for severe pain.     VITAMIN B COMPLEX PO  Take 1 tablet by mouth daily.     VITAMIN C PO  Take 1 tablet by mouth daily.         Follow-up Information   Follow up with VAN Dinah BeersRIGT III,PETER, MD On 03/04/2014. (Appointment time is at 2:30 pm)  Specialty:  Cardiothoracic Surgery   Contact information:   [address] [place] [address] [place] [address] [phone]       Follow up with [facility] On 03/04/2014. (Please get CXR at 1:30 pm)    Contact information:   [place]       Follow up with [facility], MD. (Call for a follow up appointment for 1-2 weeks)    Specialty:  Pulmonary Disease   Contact information:   [address] [place] [address] [phone]       Signed: Ardelle Balls  PA-C  02/18/2014, 8:44 AM

## 2014-02-16 NOTE — Progress Notes (Addendum)
      [address]       [place] [id number]             [phone]      4 Days Post-Op Procedure(s) (LRB): SUBXYPHOID PERICARDIAL WINDOW (N/A) MINI/LIMITED THORACOTOMY, Biopsy of Medialstinal lymph nodes (Left)  Subjective:  Ms. Sharon Case is feeling okay this morning.  Her Left Upper arm remains swollen, warm and tender to touch.  + ambulation   Objective: Vital signs in last 24 hours: Temp:  [98.2 F (36.8 C)-98.4 F (36.9 C)] 98.2 F (36.8 C) (10/12 0726) Pulse Rate:  [84-116] 98 (10/12 0700) Cardiac Rhythm:  [-] Sinus tachycardia (10/12 0725) Resp:  [11-27] 14 (10/12 0700) BP: (98-129)/(64-83) 122/83 mmHg (10/12 0600) SpO2:  [87 %-99 %] 97 % (10/12 0700) Weight:  [153 lb 7 oz (69.6 kg)] 153 lb 7 oz (69.6 kg) (10/12 0600)  Intake/Output from previous day: 10/11 0701 - 10/12 0700 In: 913.5 [P.O.:300; I.V.:313.5; IV Piggyback:300] Out: 3245 [Urine:3025; Chest Tube:220]  General appearance: alert, cooperative and no distress Heart: regular rate and rhythm Lungs: diminished breath sounds bibasilar Abdomen: soft, non-tender; bowel sounds normal; no masses,  no organomegaly Extremities: edema trace Wound: clean and dry  Lab Results:  Recent Labs  02/14/14 0434 02/15/14 0430  WBC 10.9* 9.4  HGB 11.8* 12.4  HCT 35.5* 37.5  PLT 288 303   BMET:  Recent Labs  02/14/14 0434 02/15/14 0430  NA 138 136*  K 3.6* 3.7  CL 99 97  CO2 29 30  GLUCOSE 94 96  BUN 6 10  CREATININE 0.57 0.57  CALCIUM 8.2* 8.4    PT/INR: No results found for this basename: LABPROT, INR,  in the last 72 hours ABG    Component Value Date/Time   PHART 7.449 02/13/2014 0437   HCO3 27.1* 02/13/2014 0437   TCO2 28.4 02/13/2014 0437   ACIDBASEDEF 1.2 02/11/2014 0500   O2SAT 98.5 02/13/2014 0437   CBG (last 3)   Recent Labs  02/13/14 1736 02/14/14 0009 02/14/14 0648  GLUCAP 91 114* 105*    Assessment/Plan: S/P Procedure(s) (LRB): SUBXYPHOID PERICARDIAL WINDOW  (N/A) MINI/LIMITED THORACOTOMY, Biopsy of Medialstinal lymph nodes (Left)  1. CV- hemodynamically stable, continue Colchicine for Pericarditis 2. Chest tube 250 cc output overnight, CXR shows continued left pleural effusion, will leave in place for now 3. Renal- creatinine, lytes okay, weight is at baseline continue diuresis for now 4. LUE Extremity edema- Cellulitis vs. DVT- on Vancomycin currently, doppler studies pending 5. D/C Foley 6. Dispo- patient stable, pathology shows no evidence of malignancy, possibly transfer to 2W   LOS: 7 days    BARRETT, ERIN 02/16/2014  Remove pericardial drain when output < 100 cc in 24 hrs Check US of L arm for DVT tx to 2 west when pericardial drain out Short course of steroids for pericarditiis  patient examined and medical record reviewed,agree with above note. VAN TRIGT III,Karyna Bessler 02/16/2014

## 2014-02-16 NOTE — Progress Notes (Signed)
Notified Dr. Laneta SimmersBartle of pts c/o of chest pressure and nausea. No new orders received.

## 2014-02-16 NOTE — Progress Notes (Signed)
Report to 2 W RN 

## 2014-02-16 NOTE — Progress Notes (Signed)
Attempted to call report to 2W 

## 2014-02-16 NOTE — Progress Notes (Signed)
Family Medicine Teaching Service Social Note  Patient name: Sharon MantleShelby Case Medical record number: [id number] Date of birth: 10-13-1960 Age: 53 y.o. Gender: female  Patient s/p Pericardial window procedure 10/8.  Notes feeling improved today. Was able to get up and walk 2 laps around the unit this AM.  PE: Gen: NAD, sitting up in chair at bedside CV: RRR, no murmurs Pulm: CTAB anteriorly, no w/r/c Ext: RUE swollen and erythematous  Assessment: Patient with pericarditis s/p subxyphoid pericardial window. HD stable, on 1L [place], chest tube in place.  PLAN: Per primary team ([facility]) - appreciate care provided to patient by [facility]  - Will arrange for PCP follow-up at discharge   Shirlee LatchAngela Leaf K[employer]le, MD PGY-1,  [facility] FPT[facility] team pager: [phone] (text pages welcome) - please page [facility] (primary team) first with any questions

## 2014-02-16 NOTE — Discharge Instructions (Signed)
Mediastinoscopy, Care After °Refer to this sheet in the next few weeks. These instructions provide you with information on caring for yourself after your procedure. Your health care provider may also give you more specific instructions. Your treatment has been planned according to current medical practices, but problems sometimes occur. Call your health care provider if you have any problems or questions after your procedure. °WHAT TO EXPECT AFTER THE PROCEDURE °After your procedure, it is typical to have the following: °· Slight pain in the incision area. °· Sore throat. °HOME CARE INSTRUCTIONS  °· Take all medicines as directed by your health care provider. °· Use showers for bathing. Do not bathe, swim, or use a hot tub until directed by your health care provider. °· Remove your bandages (dressings) after 48 hours unless your health care provider tells you otherwise. °· You may resume your normal diet. °· Limit your activities as directed by your health care provider. Avoid lifting or driving until your health care provider approves. °· Keep all follow-up visits as directed by your health care provider. °· Make sure you know how to get any test results. °SEEK MEDICAL CARE IF: °· You have increased bleeding from the incision site. °· You have drainage, redness, swelling, or pain at your incision site. °· You have a fever. °· You notice a bad smell coming from the incision or bandages. °SEEK IMMEDIATE MEDICAL CARE IF:  °· You have a rash. °· You have difficulty breathing. °· You have chest pain or shortness of breath. °Document Released: 11/11/2004 Document Revised: 09/08/2013 Document Reviewed: 12/18/2012 °ExitCare® Patient Information ©2015 [employer]. This information is not intended to replace advice given to you by your health care provider. Make sure you discuss any questions you have with your health care provider. ° °

## 2014-02-16 NOTE — Progress Notes (Signed)
Patient ID: Sharon Case, female   DOB: 09/23/1960, 53 y.o.   MRN: [id number]    Subjective:  LUE less sore  With ice pack on it  Objective:  Filed Vitals:   02/16/14 0500 02/16/14 0600 02/16/14 0700 02/16/14 0726  BP:  122/83    Pulse: 84 101 98   Temp:    98.2 F (36.8 C)  TempSrc:    Oral  Resp: 13 19 14    Height:      Weight:  153 lb 7 oz (69.6 kg)    SpO2: 96% 94% 97%     Intake/Output from previous day:  Intake/Output Summary (Last 24 hours) at 02/16/14 0744 Last data filed at 02/16/14 0700  Gross per 24 hour  Intake  913.5 ml  Output   3245 ml  Net -2331.5 ml    Physical Exam: Affect appropriate Healthy:  appears stated age HEENT: normal Neck supple with no adenopathy JVP normal no bruits no thyromegaly Lungs clear with no wheezing and good diaphragmatic motion Heart:  S1/S2 no murmur, no rub, gallop or click PMI normal Abdomen: benighn, BS positve, no tenderness, no AAA no bruit.  No HSM or HJR Distal pulses intact with no bruits No edema Neuro non-focal Skin warm and dry No muscular weakness Left IJ Chest tube post VATS  Lab Results: Basic Metabolic Panel:  Recent Labs  40/98/1109/02/19 0434 02/15/14 0430  NA 138 136*  K 3.6* 3.7  CL 99 97  CO2 29 30  GLUCOSE 94 96  BUN 6 10  CREATININE 0.57 0.57  CALCIUM 8.2* 8.4   Liver Function Tests:  Recent Labs  02/14/14 0434  AST 15  ALT 26  ALKPHOS 80  BILITOT 0.5  PROT 6.5  ALBUMIN 2.2*   CBC:  Recent Labs  02/14/14 0434 02/15/14 0430  WBC 10.9* 9.4  HGB 11.8* 12.4  HCT 35.5* 37.5  MCV 85.7 85.8  PLT 288 303    Imaging: Dg Chest Port 1 View  02/15/2014   CLINICAL DATA:  Followup bilateral pleural effusions and associated atelectasis or pneumonia in the lower lobes.  EXAM: PORTABLE CHEST - 1 VIEW  COMPARISON:  Portable chest x-ray yesterday and dating back to 02/12/2014. CTA chest 02/09/2014.  FINDINGS: Two left chest tubes in place with No pneumothorax and no evidence of  significant residual left pleural effusion. Airspace consolidation with air bronchograms in the left lower lobe, with improved aeration since yesterday. Decreasing right pleural effusion and improved aeration in the right lower lobe, with only mild atelectasis persisting. No new pulmonary parenchymal abnormalities. Cardiac silhouette enlarged but stable. Pulmonary vascularity normal. Left jugular central venous catheter tip projects over the upper SVC, unchanged.  IMPRESSION: Support apparatus satisfactory. No pneumothorax with left chest tubes in place. Decreasing bilateral pleural effusions, though small effusions persist. Improved aeration in the lower lobes, with moderate atelectasis or pneumonia persisting in the left lower lobe and mild atelectasis persisting in the right lower lobe. No new abnormalities.   Electronically Signed   By: Hulan Saashomas  Lawrence M.D.   On: 02/15/2014 09:06    Cardiac Studies:  ECG:  ST rate 125 otherwise normal no pericardial changes    Telemetry:  NSR no PAF  Echo:   Medications:   . acetaminophen  1,000 mg Oral 4 times per day   Or  . acetaminophen (TYLENOL) oral liquid 160 mg/5 mL  1,000 mg Oral 4 times per day  . bisacodyl  10 mg Oral Daily  . colchicine  0.6 mg Oral BID  . doxycycline  50 mg Oral Q12H  . furosemide  20 mg Intravenous BID  . ibuprofen  600 mg Oral TID  . pantoprazole  40 mg Oral Daily  . senna-docusate  1 tablet Oral QHS  . sodium chloride  10-40 mL Intracatheter Q12H  . vancomycin  750 mg Intravenous Q12H     . dextrose 5 % and 0.45 % NaCl with KCl 20 mEq/L 10 mL/hr at 02/14/14 0300    Assessment/Plan:  Pericardial Effusion:  Post subxiphoid window  Path with no CA and "noncaseating granuloma"  Continue NSAI/Colchicine VATS:  Chest tube removal per [facility] ID:  Contnues on vanc and doxycycline  LUE improved  Charlton Hawseter Emagene Merfeld 02/16/2014, 7:44 AM

## 2014-02-17 ENCOUNTER — Inpatient Hospital Stay (HOSPITAL_COMMUNITY): Payer: BC Managed Care – PPO

## 2014-02-17 LAB — CBC
HCT: 37.8 % (ref 36.0–46.0)
Hemoglobin: 12.5 g/dL (ref 12.0–15.0)
MCH: 28.5 pg (ref 26.0–34.0)
MCHC: 33.1 g/dL (ref 30.0–36.0)
MCV: 86.3 fL (ref 78.0–100.0)
PLATELETS: 346 10*3/uL (ref 150–400)
RBC: 4.38 MIL/uL (ref 3.87–5.11)
RDW: 13.3 % (ref 11.5–15.5)
WBC: 10 10*3/uL (ref 4.0–10.5)

## 2014-02-17 LAB — BASIC METABOLIC PANEL
ANION GAP: 12 (ref 5–15)
BUN: 9 mg/dL (ref 6–23)
CO2: 29 mEq/L (ref 19–32)
Calcium: 8.7 mg/dL (ref 8.4–10.5)
Chloride: 101 mEq/L (ref 96–112)
Creatinine, Ser: 0.57 mg/dL (ref 0.50–1.10)
GFR calc Af Amer: >90 mL/min (ref >90)
Glucose, Bld: 103 mg/dL — ABNORMAL HIGH (ref 70–99)
POTASSIUM: 3.5 meq/L — AB (ref 3.7–5.3)
SODIUM: 142 meq/L (ref 137–147)

## 2014-02-17 MED ORDER — POTASSIUM CHLORIDE CRYS ER 20 MEQ PO TBCR
20.0000 meq | EXTENDED_RELEASE_TABLET | Freq: Every day | ORAL | Status: DC
  Administered 2014-02-17: 20 meq via ORAL
  Filled 2014-02-17 (×2): qty 1

## 2014-02-17 MED ORDER — METOPROLOL SUCCINATE ER 25 MG PO TB24
25.0000 mg | ORAL_TABLET | Freq: Every day | ORAL | Status: DC
  Administered 2014-02-17: 25 mg via ORAL
  Filled 2014-02-17 (×2): qty 1

## 2014-02-17 MED ORDER — COLCHICINE 0.6 MG PO TABS: 0.6000 mg | ORAL_TABLET | Freq: Two times a day (BID) | ORAL | Status: DC

## 2014-02-17 MED ORDER — IBUPROFEN 600 MG PO TABS: 600.0000 mg | ORAL_TABLET | Freq: Three times a day (TID) | ORAL | Status: DC

## 2014-02-17 MED ORDER — OXYCODONE HCL 5 MG PO TABS: 5.0000 mg | ORAL_TABLET | ORAL | Status: DC | PRN

## 2014-02-17 MED ORDER — AZITHROMYCIN 250 MG PO TABS: ORAL_TABLET | ORAL | Status: DC

## 2014-02-17 MED ORDER — AZITHROMYCIN 1 G PO PACK: 1.0000 g | PACK | Freq: Once | ORAL | Status: DC

## 2014-02-17 MED ORDER — METOPROLOL SUCCINATE ER 25 MG PO TB24: 25.0000 mg | ORAL_TABLET | Freq: Every day | ORAL | Status: DC

## 2014-02-17 MED ORDER — POTASSIUM CHLORIDE CRYS ER 20 MEQ PO TBCR
40.0000 meq | EXTENDED_RELEASE_TABLET | Freq: Once | ORAL | Status: AC
  Administered 2014-02-17: 40 meq via ORAL
  Filled 2014-02-17: qty 2

## 2014-02-17 NOTE — Progress Notes (Signed)
I was unable to get around to see Sharon Case today but I did have a chance to review the pathology of lymph node and pericardial biopsies. I have discussed with Dr Marchelle Gearingamaswamy  It appears, based on clinical and radiographic presentation, that she does have sarcoidosis It is unclear whether the pericardial effusion is due to sarcoidosis as pericardial biopsy did not show granulomata  Dr Marchelle Gearingamaswamy would like to see pt in office in one to two wks  At discharge, call [phone] to schedule F/U appointment with Dr Marchelle Gearingamaswamy  Call me if there are any questions   Sharon Fischeravid Simonds, MD ; [facility] Mobile [phone].  After 5:30 PM or weekends, call [phone]

## 2014-02-17 NOTE — Progress Notes (Signed)
Patient: Sharon MantleShelby Case / Admit Date: 02/09/2014 / Date of Encounter: 02/17/2014, 9:29 AM   Subjective: Feeling much better. Still with mild chest pressure when laying flat or with deep inspiration. She says it's been a bit of an emotional day for her.   Objective: Telemetry: sinus tach 100-110 Physical Exam: Blood pressure 140/80, pulse 88, temperature 98.4 F (36.9 C), temperature source Oral, resp. rate 18, height 5\' 7"  (1.702 m), weight 153 lb 7 oz (69.6 kg), last menstrual period 02/07/2014, SpO2 97.00%. General: Well developed, well nourished AAF in no acute distress. Looks younger than stated age. Head: Normocephalic, atraumatic, sclera non-icteric, no xanthomas, nares are without discharge. Neck: Negative for carotid bruits. JVP not elevated. Lungs: Clear bilaterally to auscultation without wheezes, rales, or rhonchi. Breathing is unlabored. Heart: Reg rhythm, tachycardic, S1 S2 without murmurs, rubs, or gallops. Wound clean and dry without dehiscence Abdomen: Soft, non-tender, non-distended with normoactive bowel sounds. No rebound/guarding. Extremities: No clubbing or cyanosis. No edema. Distal pedal pulses are 2+ and equal bilaterally. Neuro: Alert and oriented X 3. Moves all extremities spontaneously. Psych:  Responds to questions appropriately with a normal affect.   Intake/Output Summary (Last 24 hours) at 02/17/14 0929 Last data filed at 02/16/14 2205  Gross per 24 hour  Intake    370 ml  Output      0 ml  Net    370 ml    Inpatient Medications:  . acetaminophen  1,000 mg Oral 4 times per day   Or  . acetaminophen (TYLENOL) oral liquid 160 mg/5 mL  1,000 mg Oral 4 times per day  . bisacodyl  10 mg Oral Daily  . colchicine  0.6 mg Oral BID  . furosemide  20 mg Intravenous BID  . ibuprofen  600 mg Oral TID  . pantoprazole  40 mg Oral Daily  . potassium chloride  20 mEq Oral Daily  . predniSONE  20 mg Oral Q breakfast  . senna-docusate  1 tablet Oral QHS  .  sodium chloride  10-40 mL Intracatheter Q12H  . vancomycin  750 mg Intravenous Q12H   Infusions:    Labs:  Recent Labs  02/15/14 0430 02/17/14 0505  NA 136* 142  K 3.7 3.5*  CL 97 101  CO2 30 29  GLUCOSE 96 103*  BUN 10 9  CREATININE 0.57 0.57  CALCIUM 8.4 8.7   No results found for this basename: AST, ALT, ALKPHOS, BILITOT, PROT, ALBUMIN,  in the last 72 hours  Recent Labs  02/15/14 0430 02/17/14 0505  WBC 9.4 10.0  HGB 12.4 12.5  HCT 37.5 37.8  MCV 85.8 86.3  PLT 303 346   Radiology/Studies:  Dg Chest 2 View 02/17/2014   CLINICAL DATA:  53 year old female status post pericardial window and biopsy of AP window lymph nodes on 02/12/2014. Chest tube removal. Initial encounter.  EXAM: CHEST  2 VIEW  COMPARISON:  02/16/2014, and earlier.  FINDINGS: PA and lateral views. Stable left IJ central line. Regressed small left pneumothorax, minimal residual. Left pericardial or mediastinal drain is been removed. Interval improved left lung base opacity. Small bilateral pleural effusions. No pulmonary edema. Streaky left perihilar opacity most resembles atelectasis. No areas of worsening ventilation. No acute osseous abnormality identified.  IMPRESSION: 1. Pericardial or mediastinal drain removed. Stable left IJ central line. 2. Regressed small left pneumothorax, minimal residual. 3. Bilateral pleural effusions and atelectasis. Interval improved left lung base ventilation.   Electronically Signed   By: Si GaulLee  Hall M.D.  On: 02/17/2014 07:57    Ct Angio Chest Pe W/cm &/or Wo Cm 02/09/2014   CLINICAL DATA:  Initial encounter for shortness of breath and chest pain.  EXAM: CT ANGIOGRAPHY CHEST WITH CONTRAST  TECHNIQUE: Multidetector CT imaging of the chest was performed using the standard protocol during bolus administration of intravenous contrast. Multiplanar CT image reconstructions and MIPs were obtained to evaluate the vascular anatomy.  CONTRAST:  OMNIPAQUE IOHEXOL 350 MG/ML SOLN   COMPARISON:  None.  FINDINGS: Mediastinum: The heart size appears normal. There is a moderate pericardial effusion identified. The trachea appears patent and is midline. Normal appearance of the esophagus. Prominent mediastinal and hilar lymph nodes are identified. Pre-vascular lymph node is enlarged measuring 1.3 cm, image 24/series 4. Sub- carinal lymph node measures 1.2 cm, image 34/series 4. No supraclavicular or axillary adenopathy. The main pulmonary artery appears normal. No lobar or segmental pulmonary artery filling defects identified to suggest a clinically significant pulmonary embolus.  Lungs/Pleura: There is a small left pleural effusion. Dependent changes in atelectasis is noted in the lung bases. Subpleural nodule in the periphery of the right lower lobe measures 4 mm, image 46/series 4.  Upper Abdomen: The visualized portions of the liver and spleen are unremarkable. The visualized portions of the adrenal glands are unremarkable.  Musculoskeletal: Review of the visualized osseous structures is remarkable for mild multi level disc space narrowing and ventral endplate spurring.  Review of the MIP images confirms the above findings.  IMPRESSION: 1. No evidence for acute pulmonary embolus. 2. Moderate pericardial effusion. 3. Enlarged mediastinal lymph nodes are identified. This is a nonspecific finding but may be seen with metastatic adenopathy, lymphoproliferative disorder as well as granulomatous inflammation or infection. Careful clinical correlation advise. 4. Lower lobe dependent changes and atelectasis. 5. Nonspecific right lower lobe pulmonary nodule measures 4 mm. If the patient is at high risk for bronchogenic carcinoma, follow-up chest CT at 1 year is recommended. If the patient is at low risk, no follow-up is needed. This recommendation follows the consensus statement: Guidelines for Management of Small Pulmonary Nodules Detected on CT Scans: A Statement from the Fleischner Society as  published in Radiology 2005; 237:395-400.   Electronically Signed   By: Signa Kell M.D.   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  EXAM: PORTABLE CHEST - 1 VIEW  COMPARISON:  Multiple prior, most recent 02/15/2014. CT chest 02/09/2014  FINDINGS: Cardiomediastinal silhouette relatively unchanged in size and contour from comparison chest x-ray, partially obscured by overlying lung and pleural disease.  Persistent opacity at the bilateral lung bases, partially obscuring the right hemidiaphragm and obscuring the left hemidiaphragm and retrocardiac space.  Interval removal of left-sided thoracostomy tube, with small residual pneumothorax.  Improving aeration of the upper right and left lungs, particularly from the comparison 02/14/2014.  Unchanged position of left IJ central venous catheter, with the tip appearing to terminate near the confluence of the azygos and SVC.  Unchanged position of the pericardial drain.  Overlying EKG leads.  No acute bony abnormality.  IMPRESSION: Interval removal of left-sided thoracostomy tube with small residual pneumothorax.  Persisting opacities at the lung bases, likely a combination of pleural fluid, atelectasis, and/ or consolidation.  Upper lung aeration has improved compared to plain film dated 02/13/2014 and 02/14/2014, and remains relatively similar since the comparison of 02/15/2014.  Unchanged position of pericardial drain and left IJ central catheter.  Signed,  Yvone NeuJaime S. Loreta AveWagner, DO  [facility]  [facility]   Electronically Signed   By: Gilmer MorJaime  Wagner D.O.   On: 02/16/2014 07:49     Assessment and Plan  1. Chest pain secondary to acute pericarditis with moderate pericardial effusion  - s/p subxiphoid pericardial window 02/12/14  - on colchicine, NSAIDS, PPI, and short course of prednisone 2. Diffuse mediastinal lymphadenopathy - path 02/12/14:  pericardium biopsy with fibrinous material (no malignancy); lymph node biopsies with non-caseating granulomatous inflammation - "the histologic features raise the possibility of sarcoidosis" which is what PCCM was concerned for per consult note 10/6 - PCCM is no longer following - will discuss with MD what the plan is to further eval for sarcoid 3. Sinus tachycardia likely related to the above   - CTA 02/09/14 negative for PE  - TSH 02/10/14 normal  - agree with Toprol initiation 4. LUE cellulitis  - vasc duplex neg for DVT  - on abx per primary team  Signed, Dayna Dunn PA-C  Biopsy consistent with sarcoid  Needs outpatient pulmonary f/u Dr Sung AmabileSimonds to see Today continue current dose of steroids  Exam remarkable for no rub  Sub xiphoid incision Healing well chest tube out  [employer] Shanyah Gattuso

## 2014-02-17 NOTE — Progress Notes (Addendum)
      [address]       [place] [id number]             [phone]       5 Days Post-Op Procedure(s) (LRB): SUBXYPHOID PERICARDIAL WINDOW (N/A) MINI/LIMITED THORACOTOMY, Biopsy of Medialstinal lymph nodes (Left)  Subjective: Patient without complaints.  Objective: Vital signs in last 24 hours: Temp:  [97.9 F (36.6 C)-98.4 F (36.9 C)] 98.4 F (36.9 C) (10/13 0517) Pulse Rate:  [88-116] 88 (10/13 0517) Cardiac Rhythm:  [-] Sinus tachycardia (10/12 1942) Resp:  [14-20] 18 (10/13 0517) BP: (106-140)/(58-80) 140/80 mmHg (10/13 0517) SpO2:  [95 %-99 %] 97 % (10/13 0517)      Intake/Output from previous day: 10/12 0701 - 10/13 0700 In: 550 [P.O.:540; I.V.:10] Out: 485 [Urine:485]   Physical Exam:  Cardiovascular: Tachycardic Pulmonary: Clear to auscultation bilaterally; no rales, wheezes, or rhonchi. Abdomen: Soft, non tender, bowel sounds present. Extremities: Mild bilateral lower extremity edema. Wounds: Clean and dry.  No erythema or signs of infection.   Lab Results: CBC: Recent Labs  02/15/14 0430 02/17/14 0505  WBC 9.4 10.0  HGB 12.4 12.5  HCT 37.5 37.8  PLT 303 346   BMET:  Recent Labs  02/15/14 0430 02/17/14 0505  NA 136* 142  K 3.7 3.5*  CL 97 101  CO2 30 29  GLUCOSE 96 103*  BUN 10 9  CREATININE 0.57 0.57  CALCIUM 8.4 8.7    PT/INR: No results found for this basename: LABPROT, INR,  in the last 72 hours ABG:  INR: Will add last result for INR, ABG once components are confirmed Will add last 4 CBG results once components are confirmed  Assessment/Plan:  1. CV - ST 110's. Continue Colchicine, Motrin, and Prednisone for pericarditis. Not on BB-will discuss with Dr. Donata ClayVan Trigt 2.  Pulmonary - Chest tube removed yesterday.CXR appears stable. Encourage incentive spirometer 3. No DVT LUE. 4. Volume overload-on Lasix 20 IV bid. 5. Supplement potassium 6. Possible discharge in am  ZIMMERMAN,DONIELLE  MPA-C 02/17/2014,7:48 AM Start toprol XL 25 mg and keep in hospital today to monitor HR patient examined and medical record reviewed,agree with above note. VAN TRIGT III,Noely Kuhnle 02/17/2014

## 2014-02-18 MED ORDER — AZITHROMYCIN 250 MG PO TABS: 250.0000 mg | ORAL_TABLET | Freq: Every day | ORAL | Status: DC

## 2014-02-18 MED ORDER — AZITHROMYCIN 250 MG PO TABS: ORAL_TABLET | ORAL | Status: DC

## 2014-02-18 MED ORDER — AZITHROMYCIN 500 MG PO TABS: 500.0000 mg | ORAL_TABLET | Freq: Every day | ORAL | Status: DC

## 2014-02-18 NOTE — Progress Notes (Signed)
Pt d/c home.  Alert and oriented x 4.  IJ was removed with no problems and tip intact and pressure dressing was placed over site with instructions to pt to not remove dressing for 24 hours.  There was no redness or swelling present at the sit.  Incisions were dry/intact.  Pt had no c/o pain.  Education on wound care, diet, activity, meds and follow-up care and instructions.  Pt verbalized understanding.  Taken home by daughter via private vehicle.

## 2014-02-18 NOTE — Progress Notes (Addendum)
      [address]       [place] [id number]             [phone]       6 Days Post-Op Procedure(s) (LRB): SUBXYPHOID PERICARDIAL WINDOW (N/A) MINI/LIMITED THORACOTOMY, Biopsy of Medialstinal lymph nodes (Left)  Subjective: Patient without complaints.  Objective: Vital signs in last 24 hours: Temp:  [97.9 F (36.6 C)-98.2 F (36.8 C)] 98.2 F (36.8 C) (10/14 0500) Pulse Rate:  [79-101] 79 (10/14 0500) Cardiac Rhythm:  [-] Normal sinus rhythm (10/13 1956) Resp:  [18] 18 (10/14 0500) BP: (131-147)/(74-82) 142/78 mmHg (10/14 0500) SpO2:  [96 %-97 %] 96 % (10/14 0500)      Intake/Output from previous day: 10/13 0701 - 10/14 0700 In: 480 [P.O.:480] Out: 100 [Urine:100]   Physical Exam:  Cardiovascular: Tachycardic Pulmonary: Clear to auscultation bilaterally; no rales, wheezes, or rhonchi. Abdomen: Soft, non tender, bowel sounds present. Extremities: Mild bilateral lower extremity edema. Wounds: Clean and dry.  No erythema or signs of infection.   Lab Results: CBC:  Recent Labs  02/17/14 0505  WBC 10.0  HGB 12.5  HCT 37.8  PLT 346   BMET:   Recent Labs  02/17/14 0505  NA 142  K 3.5*  CL 101  CO2 29  GLUCOSE 103*  BUN 9  CREATININE 0.57  CALCIUM 8.7    PT/INR: No results found for this basename: LABPROT, INR,  in the last 72 hours ABG:  INR: Will add last result for INR, ABG once components are confirmed Will add last 4 CBG results once components are confirmed  Assessment/Plan:  1. CV - HR in the 90's. Continue Toprol XL 25 daily.Continue Colchicine, Motrin, and Prednisone for pericarditis. Per Dr. Donata ClayVan Trigt, stop Prednisone. Will continue Colchicine and Motrin until Monday. Start Z pak at discharge. 2.  Pulmonary - Histologic features raise the possibility of sarcoidosis. Encourage incentive spirometer 3. Cellulitis LUE-NO dvt 4. Volume overload-on Lasix 20 IV bid. Will not need at discahrge 6. Discharge  today  ZIMMERMAN,DONIELLE MPA-C 02/18/2014,7:34 AM

## 2014-02-18 NOTE — Progress Notes (Signed)
Patient ID: Katherine MantleShelby Clark-Fuller, female   DOB: May 27, 1960, 53 y.o.   MRN: [id number]  Patient: Katherine MantleShelby Clark-Fuller / Admit Date: 02/09/2014 / Date of Encounter: 02/18/2014, 9:29 AM   Subjective: Going home today no chest pain    Objective: Telemetry: sinus tach 100-110 Physical Exam: Blood pressure 142/78, pulse 79, temperature 98.2 F (36.8 C), temperature source Oral, resp. rate 18, height 5\' 7"  (1.702 m), weight 153 lb 7 oz (69.6 kg), last menstrual period 02/07/2014, SpO2 96.00%. General: Well developed, well nourished AAF in no acute distress. Looks younger than stated age. Head: Normocephalic, atraumatic, sclera non-icteric, no xanthomas, nares are without discharge. Neck: Negative for carotid bruits. JVP not elevated. Lungs: Clear bilaterally to auscultation without wheezes, rales, or rhonchi. Breathing is unlabored. Heart: Reg rhythm, tachycardic, S1 S2 without murmurs, rubs, or gallops. Wound clean and dry without dehiscence Abdomen: Soft, non-tender, non-distended with normoactive bowel sounds. No rebound/guarding. Extremities: No clubbing or cyanosis. No edema. Distal pedal pulses are 2+ and equal bilaterally. Neuro: Alert and oriented X 3. Moves all extremities spontaneously. Psych:  Responds to questions appropriately with a normal affect.   Intake/Output Summary (Last 24 hours) at 02/18/14 0929 Last data filed at 02/17/14 1500  Gross per 24 hour  Intake    240 ml  Output    100 ml  Net    140 ml    Inpatient Medications:  . [START ON 02/19/2014] azithromycin  500 mg Oral Daily   Followed by  . [START ON 02/20/2014] azithromycin  250 mg Oral Daily  . bisacodyl  10 mg Oral Daily  . colchicine  0.6 mg Oral BID  . furosemide  20 mg Intravenous BID  . ibuprofen  600 mg Oral TID  . metoprolol succinate  25 mg Oral Daily  . pantoprazole  40 mg Oral Daily  . potassium chloride  20 mEq Oral Daily  . predniSONE  20 mg Oral Q breakfast  . senna-docusate  1 tablet Oral QHS    . sodium chloride  10-40 mL Intracatheter Q12H  . vancomycin  750 mg Intravenous Q12H   Infusions:    Labs:  Recent Labs  02/17/14 0505  NA 142  K 3.5*  CL 101  CO2 29  GLUCOSE 103*  BUN 9  CREATININE 0.57  CALCIUM 8.7   No results found for this basename: AST, ALT, ALKPHOS, BILITOT, PROT, ALBUMIN,  in the last 72 hours  Recent Labs  02/17/14 0505  WBC 10.0  HGB 12.5  HCT 37.8  MCV 86.3  PLT 346   Radiology/Studies:  Dg Chest 2 View 02/17/2014   CLINICAL DATA:  53 year old female status post pericardial window and biopsy of AP window lymph nodes on 02/12/2014. Chest tube removal. Initial encounter.  EXAM: CHEST  2 VIEW  COMPARISON:  02/16/2014, and earlier.  FINDINGS: PA and lateral views. Stable left IJ central line. Regressed small left pneumothorax, minimal residual. Left pericardial or mediastinal drain is been removed. Interval improved left lung base opacity. Small bilateral pleural effusions. No pulmonary edema. Streaky left perihilar opacity most resembles atelectasis. No areas of worsening ventilation. No acute osseous abnormality identified.  IMPRESSION: 1. Pericardial or mediastinal drain removed. Stable left IJ central line. 2. Regressed small left pneumothorax, minimal residual. 3. Bilateral pleural effusions and atelectasis. Interval improved left lung base ventilation.   Electronically Signed   By: Augusto GambleLee  Hall M.D.   On: 02/17/2014 07:57    Ct Angio Chest Pe W/cm &/or Wo Cm 02/09/2014  CLINICAL DATA:  Initial encounter for shortness of breath and chest pain.  EXAM: CT ANGIOGRAPHY CHEST WITH CONTRAST  TECHNIQUE: Multidetector CT imaging of the chest was performed using the standard protocol during bolus administration of intravenous contrast. Multiplanar CT image reconstructions and MIPs were obtained to evaluate the vascular anatomy.  CONTRAST:  100mL OMNIPAQUE IOHEXOL 350 MG/ML SOLN  COMPARISON:  None.  FINDINGS: Mediastinum: The heart size appears normal. There is  a moderate pericardial effusion identified. The trachea appears patent and is midline. Normal appearance of the esophagus. Prominent mediastinal and hilar lymph nodes are identified. Pre-vascular lymph node is enlarged measuring 1.3 cm, image 24/series 4. Sub- carinal lymph node measures 1.2 cm, image 34/series 4. No supraclavicular or axillary adenopathy. The main pulmonary artery appears normal. No lobar or segmental pulmonary artery filling defects identified to suggest a clinically significant pulmonary embolus.  Lungs/Pleura: There is a small left pleural effusion. Dependent changes in atelectasis is noted in the lung bases. Subpleural nodule in the periphery of the right lower lobe measures 4 mm, image 46/series 4.  Upper Abdomen: The visualized portions of the liver and spleen are unremarkable. The visualized portions of the adrenal glands are unremarkable.  Musculoskeletal: Review of the visualized osseous structures is remarkable for mild multi level disc space narrowing and ventral endplate spurring.  Review of the MIP images confirms the above findings.  IMPRESSION: 1. No evidence for acute pulmonary embolus. 2. Moderate pericardial effusion. 3. Enlarged mediastinal lymph nodes are identified. This is a nonspecific finding but may be seen with metastatic adenopathy, lymphoproliferative disorder as well as granulomatous inflammation or infection. Careful clinical correlation advise. 4. Lower lobe dependent changes and atelectasis. 5. Nonspecific right lower lobe pulmonary nodule measures 4 mm. If the patient is at high risk for bronchogenic carcinoma, follow-up chest CT at 1 year is recommended. If the patient is at low risk, no follow-up is needed. This recommendation follows the consensus statement: Guidelines for Management of Small Pulmonary Nodules Detected on CT Scans: A Statement from the Fleischner Society as published in Radiology 2005; 237:395-400.   Electronically Signed   By: Signa Kellaylor  Stroud  M.D.   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  EXAM: PORTABLE CHEST - 1 VIEW  COMPARISON:  Multiple prior, most recent 02/15/2014. CT chest 02/09/2014  FINDINGS: Cardiomediastinal silhouette relatively unchanged in size and contour from comparison chest x-ray, partially obscured by overlying lung and pleural disease.  Persistent opacity at the bilateral lung bases, partially obscuring the right hemidiaphragm and obscuring the left hemidiaphragm and retrocardiac space.  Interval removal of left-sided thoracostomy tube, with small residual pneumothorax.  Improving aeration of the upper right and left lungs, particularly from the comparison 02/14/2014.  Unchanged position of left IJ central venous catheter, with the tip appearing to terminate near the confluence of the azygos and SVC.  Unchanged position of the pericardial drain.  Overlying EKG leads.  No acute bony abnormality.  IMPRESSION: Interval removal of left-sided thoracostomy tube with small residual pneumothorax.  Persisting opacities at the lung bases, likely a combination of pleural fluid, atelectasis, and/ or consolidation.  Upper lung aeration has improved compared to plain film dated 02/13/2014 and 02/14/2014, and remains relatively similar since the comparison of 02/15/2014.  Unchanged position of pericardial drain and left IJ central catheter.  Signed,  Yvone NeuJaime S. Loreta AveWagner, DO  [facility]  [facility]  Electronically Signed   By: Gilmer Mor D.O.   On: 02/16/2014 07:49     Assessment and Plan  1. Chest pain secondary to acute pericarditis with moderate pericardial effusion  - s/p subxiphoid pericardial window 02/12/14  - on colchicine, NSAIDS, PPI, and short course of prednisone 2. Diffuse mediastinal lymphadenopathy - path 02/12/14: pericardium biopsy with fibrinous material (no malignancy); lymph node biopsies with  non-caseating granulomatous inflammation - "the histologic features raise the possibility of sarcoidosis" which is what PCCM was concerned for per consult note 10/6 - PCCM is no longer following - will discuss with MD what the plan is to further eval for sarcoid 3. Sinus tachycardia likely related to the above   - CTA 02/09/14 negative for PE  - TSH 02/10/14 normal  - agree with Toprol initiation 4. LUE cellulitis  - vasc duplex neg for DVT  - on abx per primary team   Biopsy consistent with sarcoid  Has outpatient f/u with pulmonary continue NSAI/colchicince  Steroid taper per pulmonary given sarcoid Exam remarkable for no rub  Sub xiphoid incision Healing well chest tube out  [employer]

## 2014-02-18 NOTE — Progress Notes (Addendum)
      [address]       [place] [id number]             [phone]       6 Days Post-Op Procedure(s) (LRB): SUBXYPHOID PERICARDIAL WINDOW (N/A) MINI/LIMITED THORACOTOMY, Biopsy of Medialstinal lymph nodes (Left)  Subjective: Patient without complaints.  Objective: Vital signs in last 24 hours: Temp:  [97.9 F (36.6 C)-98.2 F (36.8 C)] 98.2 F (36.8 C) (10/14 0500) Pulse Rate:  [79-101] 79 (10/14 0500) Cardiac Rhythm:  [-] Normal sinus rhythm (10/13 1956) Resp:  [18] 18 (10/14 0500) BP: (131-147)/(74-82) 142/78 mmHg (10/14 0500) SpO2:  [96 %-97 %] 96 % (10/14 0500)      Intake/Output from previous day: 10/13 0701 - 10/14 0700 In: 480 [P.O.:480] Out: 100 [Urine:100]   Physical Exam:  Cardiovascular: RRR Pulmonary: Clear to auscultation bilaterally; no rales, wheezes, or rhonchi. Abdomen: Soft, non tender, bowel sounds present. Extremities: Trace lower extremity edema. LUE with erythema and some swelling. Wounds: Clean and dry.  No erythema or signs of infection.   Lab Results: CBC:  Recent Labs  02/17/14 0505  WBC 10.0  HGB 12.5  HCT 37.8  PLT 346   BMET:   Recent Labs  02/17/14 0505  NA 142  K 3.5*  CL 101  CO2 29  GLUCOSE 103*  BUN 9  CREATININE 0.57  CALCIUM 8.7    PT/INR: No results found for this basename: LABPROT, INR,  in the last 72 hours ABG:  INR: Will add last result for INR, ABG once components are confirmed Will add last 4 CBG results once components are confirmed  Assessment/Plan:  1. CV - HR in the 90's. Continue Toprol XL 25 daily.Continue Colchicine, Motrin, and Prednisone for pericarditis. Will discuss as to length of Prednisone, given that this may be sarcoidosis. Will continue Colchicine and Motrin until Monday. 2.  Pulmonary - Histologic features raise the possibility of sarcoidosis. Encourage incentive spirometer 3. Cellulitis LUE. On Vanco.No DVT LUE. 4. Volume overload-on Lasix 20 IV bid. 6.  Discharge today  ZIMMERMAN,DONIELLE MPA-C 02/18/2014,7:43 AM  Home on po Z pack for cellulitis/infiltrated IV site L arm and severe pericarditis Last dose prednisone today Needs apt  w/ Dr Marchelle Gearingamaswamy 2 wks  patient examined and medical record reviewed,agree with above note. VAN TRIGT III,PETER 02/18/2014

## 2014-02-20 ENCOUNTER — Ambulatory Visit (INDEPENDENT_AMBULATORY_CARE_PROVIDER_SITE_OTHER): Payer: BC Managed Care – PPO | Admitting: Internal Medicine

## 2014-02-20 ENCOUNTER — Encounter: Payer: Self-pay | Admitting: Internal Medicine

## 2014-02-20 ENCOUNTER — Other Ambulatory Visit: Payer: BC Managed Care – PPO

## 2014-02-20 VITALS — BP 110/84 | HR 98 | Ht 63.0 in | Wt 152.8 lb

## 2014-02-20 DIAGNOSIS — I319 Disease of pericardium, unspecified: Secondary | ICD-10-CM

## 2014-02-20 DIAGNOSIS — R590 Localized enlarged lymph nodes: Secondary | ICD-10-CM

## 2014-02-20 DIAGNOSIS — I313 Pericardial effusion (noninflammatory): Secondary | ICD-10-CM

## 2014-02-20 DIAGNOSIS — R599 Enlarged lymph nodes, unspecified: Secondary | ICD-10-CM

## 2014-02-20 DIAGNOSIS — I3139 Other pericardial effusion (noninflammatory): Secondary | ICD-10-CM

## 2014-02-20 NOTE — Patient Instructions (Signed)
Please do quantiferon gold test I am going to run your story with some national experts about treatment decisions Call back in  1 week to check with me regarding prednisone decision REturn to see me in 3 months or sooner if needed

## 2014-02-20 NOTE — Progress Notes (Signed)
Subjective:    Patient ID: Sharon Case, female    DOB: June 29, 1960, 53 y.o.   MRN: [id number]   HPI   OV 02/20/2014  Chief Complaint  Patient presents with  . HFU    Pt denies any SOB, chest pain or tightness. She does states she has a dry cough.    53 year old AA female, non smoker, from the [place] without travel hx and TB exposure hx. Admitted with 6 week atypical chest pain and dyspnea that progressed over 2 weeks. CTA of the chest was completed to rule out PE and was negative but did demonstrate a moderate pericardial effusion, mediastinal LAN, lower atx, R 8m nodule.  ECHO assessed and pericardial effusion was free flowing without tamponade physiology. On 02/12/14:  Mediastinal biopsy showed non-caseating granuloma. SHe had pericardia biopsythat was non-diagnostic with inflamed fibrous material. ESR was 58, ACE normal. Autoimmune negative  Today 02/20/2014 - presents for followup. STill with some mild cough. Dyspnea improved but not resolved. Overall well. 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 Know it is not cancer andis idiopathic. REst she is not certain. Her  Female friend with her has more insight into her disease.      has a past medical history of Sinus tachycardia.   has past surgical history that includes Subxyphoid pericardial window (N/A, 02/12/2014) and Thoracotomy (Left, 02/12/2014).   reports that she has never smoked. She does not have any smokeless tobacco history on file.  Allergies  Allergen Reactions  . Sulfa Antibiotics Itching and Rash    There is no immunization history on file for this patient.    Review of Systems  Constitutional: Negative for fever and unexpected weight change.  HENT: Negative for congestion, dental problem, ear pain, nosebleeds, postnasal drip, rhinorrhea, sinus pressure, sneezing, sore throat and trouble swallowing.   Eyes: Negative for redness and itching.  Respiratory: Positive for cough. Negative for  chest tightness, shortness of breath and wheezing.   Cardiovascular: Negative for palpitations and leg swelling.  Gastrointestinal: Negative for nausea and vomiting.  Genitourinary: Negative for dysuria.  Musculoskeletal: Negative for joint swelling.  Skin: Negative for rash.  Neurological: Negative for headaches.  Hematological: Does not bruise/bleed easily.  Psychiatric/Behavioral: Negative for dysphoric mood. The patient is not nervous/anxious.        Objective:   Physical Exam  Vitals reviewed. Constitutional: She is oriented to person, place, and time. She appears well-developed and well-nourished. No distress.  pleasant  HENT:  Head: Normocephalic and atraumatic.  Right Ear: External ear normal.  Left Ear: External ear normal.  Mouth/Throat: Oropharynx is clear and moist. No oropharyngeal exudate.  Left IJ site mild bruising +  Eyes: Conjunctivae and EOM are normal. Pupils are equal, round, and reactive to light. Right eye exhibits no discharge. Left eye exhibits no discharge. No scleral icterus.  Neck: Normal range of motion. Neck supple. No JVD present. No tracheal deviation present. No thyromegaly present.  LEft infra-clavicular area -surgical wound healthy +  Cardiovascular: Normal rate, regular rhythm, normal heart sounds and intact distal pulses.  Exam reveals no gallop and no friction rub.   No murmur heard. Pulmonary/Chest: Effort normal and breath sounds normal. No respiratory distress. She has no wheezes. She has no rales. She exhibits no tenderness.  Mediastinal windown sutures - intact and wound healthy  Abdominal: Soft. Bowel sounds are normal. She exhibits no distension and no mass. There is no tenderness. There is no rebound and no guarding.  Musculoskeletal: Normal range of motion. She exhibits no edema and no tenderness.  Lymphadenopathy:    She has no cervical adenopathy.  Neurological: She is alert and oriented to person, place, and time. She has normal  reflexes. No cranial nerve deficit. She exhibits normal muscle tone. Coordination normal.  Skin: Skin is warm and dry. No rash noted. She is not diaphoretic. No erythema. No pallor.  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  - IF just stage 1 sarcoid of mediastinum: then observation unless very symptomatic (natural hx is to burn out > r progress) - But, if pericardial effusion is from sarcoid, she would need steroids  - Will get qunatiferon gold to rule out TB anywayws - Will seek some expert opinion nationally on mgmt approach  Explained this to her and female friend  The verbalized understanding   > 50% of this > 25 min visit spent in face to face counseling (15 min visit converted to 25 min)

## 2014-02-23 ENCOUNTER — Encounter: Payer: Self-pay | Admitting: Family Medicine

## 2014-02-23 ENCOUNTER — Ambulatory Visit (INDEPENDENT_AMBULATORY_CARE_PROVIDER_SITE_OTHER): Payer: BC Managed Care – PPO | Admitting: Family Medicine

## 2014-02-23 VITALS — BP 119/63 | HR 98 | Temp 98.3°F | Resp 16 | Wt 153.0 lb

## 2014-02-23 DIAGNOSIS — Z09 Encounter for follow-up examination after completed treatment for conditions other than malignant neoplasm: Secondary | ICD-10-CM

## 2014-02-23 DIAGNOSIS — R05 Cough: Secondary | ICD-10-CM | POA: Diagnosis not present

## 2014-02-23 DIAGNOSIS — R059 Cough, unspecified: Secondary | ICD-10-CM

## 2014-02-23 LAB — QUANTIFERON TB GOLD ASSAY (BLOOD)
Interferon Gamma Release Assay: NEGATIVE
Mitogen value: 3.15 IU/mL
QUANTIFERON NIL VALUE: 0.06 [IU]/mL
Quantiferon Tb Ag Minus Nil Value: 0 IU/mL
TB Ag value: 0.04 IU/mL

## 2014-02-23 MED ORDER — OXYCODONE HCL 5 MG PO TABS: 5.0000 mg | ORAL_TABLET | ORAL | Status: DC | PRN

## 2014-02-23 NOTE — Discharge Summary (Signed)
patient examined and medical record reviewed,agree with above note. VAN TRIGT III,Baylei Siebels 02/23/2014   

## 2014-02-23 NOTE — Patient Instructions (Signed)
Sarcoidosis, Schaumann's Disease, Sarcoid of [place] Sarcoidosis appears briefly and heals naturally in 60 to 70 percent of cases, often without the patient knowing or doing anything about it. 20 to 30 percent of patients with sarcoidosis are left with some permanent lung damage. In 10 to 15 percent of the patients, sarcoidosis can become chronic (long lasting). When either the granulomas or fibrosis seriously affect the function of a vital organ (lungs, heart, nervous system, liver, or kidneys), sarcoidosis can be fatal. This occurs 5 to 10 percent of the time. No one can predict how sarcoidosis will progress in an individual patient. The symptoms the patient experiences, the caregiver's findings, and the patient's race can give some clues. Sarcoidosis was once considered a rare disease. We now know that it is a common chronic illness that appears all over the world. It is the most common of the fibrotic (scarring) lung disorders. Anyone can get sarcoidosis. It occurs in all races and in both sexes. The risk is greater if you are a young black adult, especially a black woman, or are of Scandinavian, German, Irish, or Puerto Rican origin. In sarcoidosis, small lumps (also called nodules or granulomas) develop in multiple organs of the body. These granulomas are small collections of inflamed cells. They commonly appear in the lungs. This is the most common organ affected. They also occur in the lymph nodes (your glands), skin, liver, and eyes. The granulomas vary in the amount of disease they produce from very little with no problems (symptoms) to causing severe illness. The cause of sarcoidosis is not known. It may be due to an abnormal immune reaction in the body. Most people will recover. A few people will develop long lasting conditions that may get worse. Women are affected more often than men. The majority of those affected are under forty years of age. Because we do not know the cause, we do not have ways to  prevent it. SYMPTOMS   Fever.  Loss of appetite.  Night sweats.  Joint pain.  Aching muscles Symptoms vary because the disease affects different parts of the body in different people. Most people who see their caregiver with sarcoidosis have lung problems. The first signs are usually a dry cough and shortness of breath. There may also be wheezing, chest pain, or a cough that brings up bloody mucus. In severe cases, lung function may become so poor that the person cannot perform even the simple routine tasks of daily life. Other symptoms of sarcoidosis are less common than lung symptoms. They can include:  Skin symptoms. Sarcoidosis can appear as a collection of tender, red bumps called erythema nodosum. These bumps usually occur on the face, shins, and arms. They can also occur as a scaly, purplish discoloration on the nose, cheeks, and ears. This is called lupus pernio. Less often, sarcoidosis causes cysts, pimples, or disfiguring over growths of skin. In many cases, the disfiguring over growths develop in areas of scars or tattoos.  Eye symptoms. These include redness, eye pain, and sensitivity to light.  Heart symptoms. These include irregular heartbeat and heart failure.  Other symptoms. A person may have paralyzed facial muscles, seizures, psychiatric symptoms, swollen salivary glands, or bone pain. DIAGNOSIS  Even when there are no symptoms, your caregiver can sometimes pick up signs of sarcoidosis during a routine examination, usually through a chest x-ray or when checking other complaints. The patient's age and race or ethnic group can raise an additional red flag that a sign or symptom could   be related to sarcoidosis.   Enlargement of the salivary or tear glands and cysts in bone tissue may also be caused by sarcoidosis.  You may have had a biopsy done that shows signs of sarcoidosis. A biopsy is a small tissue sample that is removed for laboratory testing. This tissue sample can  be taken from your lung, skin, lip, or another inflamed or abnormal area of the body.  You may have had an abnormal chest X-ray. Although you appear healthy, a chest X-ray ordered for other reasons may turn up abnormalities that suggest sarcoidosis.  Other tests may be needed. These tests may be done to rule out other illnesses or to determine the amount of organ damage caused by sarcoidosis. Some of the most common tests are:  Blood levels of calcium or angiotensin-converting enzyme may be high in people with sarcoidosis.  Blood tests to evaluate how well your liver is functioning.  Lung function tests to measure how well you are breathing.  A complete eye examination. TREATMENT  If sarcoidosis does not cause any problems, treatment may not be necessary. Your caregiver may decide to simply monitor your condition. As part of this monitoring process, you may have frequent office visits, follow-up chest X-rays, and tests of your lung function.If you have signs of moderate or severe lung disease, your doctor may recommend:  A corticosteroid drug, such as prednisone (sold under several brand names).  Corticosteroids also are used to treat sarcoidosis of the eyes, joints, skin, nerves, or heart.  Corticosteroid eye drops may be used for the eyes.  Over-the-counter medications like nonsteroidal anti-inflammatory drugs (NSAID) often are used to treat joint pain first before corticosteroids, which tend to have more side effects.  If corticosteroids are not effective or cause serious side effects, other drugs that alter or suppress the immune system may be used.  In rare cases, when sarcoidosis causes life-threatening lung disease, a lung transplant may be necessary. However, there is some risk that the new lungs also will be attacked by sarcoidosis. SEEK IMMEDIATE MEDICAL CARE IF:   You suffer from shortness of breath or a lingering cough.  You develop new problems that may be related to the  disease. Remember this disease can affect almost all organs of the body and cause many different problems. Document Released: 02/23/2004 Document Revised: 07/17/2011 Document Reviewed: 08/20/2013 ExitCare Patient Information 2015 [employer]. This information is not intended to replace advice given to you by your health care provider. Make sure you discuss any questions you have with your health care provider.  

## 2014-02-23 NOTE — Progress Notes (Signed)
   Subjective:    Patient ID: Sharon Case, female    DOB: 05-Feb-1961, 53 y.o.   MRN: [id number]  HPI  Here for hospital discharge follow up  Pain: having left shoulder/arm pain which is different from left chest wall pain that radiated to arm, 10/10 well controlled with oxycodone.  Currently taking oxycodone q4 hours, pain is worse at night.  Also taking 600mg  advil TID.  Has finished course of colchicine, pain was better controlled with trio of colchicine/advil/oxycodone.  Mediastinal adenopathy with noncaseating granuloma and pericardial effusion s/p bx => neg serologies, awaiting quantiferon gold => ?sacroid vs TB per pulmonogy - was tx'd with colchicine for ?pericarditis  Review of Systems  Constitutional: Negative for chills and diaphoresis.  Respiratory: Positive for cough (unsure if improving). Negative for shortness of breath.   Cardiovascular: Negative for leg swelling.  Gastrointestinal: Positive for nausea (with pain medicine). Negative for abdominal pain, diarrhea, constipation and anal bleeding.  Endocrine: Negative for cold intolerance and heat intolerance.  Genitourinary: Negative for dysuria, urgency, decreased urine volume and difficulty urinating.  Neurological: Positive for headaches.       Objective:   Physical Exam  Constitutional: She appears well-developed and well-nourished. No distress.  HENT:  Head: Normocephalic and atraumatic.  Eyes: Conjunctivae are normal. Pupils are equal, round, and reactive to light.  Neck: Normal range of motion. Neck supple. No thyromegaly present.  Cardiovascular: Normal rate.   No JVD, no rubs  Pulmonary/Chest: Effort normal and breath sounds normal. No respiratory distress. She has no wheezes.  Sutures x 2 breast fold no fluctuance/erythema/drainage, midline incision intact/clean/dry with dermabond peeling off  Abdominal: Soft. She exhibits no distension. There is no tenderness.  Musculoskeletal: She exhibits no edema.    Neurological: She is alert.  Skin: Skin is warm and dry. No rash noted. She is not diaphoretic. No erythema.  Psychiatric: She has a normal mood and affect. Her behavior is normal.   BP 119/63  Pulse 98  Temp(Src) 98.3 F (36.8 C) (Oral)  Resp 16  Wt 153 lb (69.4 kg)  SpO2 99%  LMP 02/07/2014        Assessment & Plan:  53 yo female s/p pericardial effusion and with mediastinal adenopathy concerning for sarcoidosis vs TB here for hospital discharge follow up, s/p subxyphoid pericardial window and limited thoracotomy  - pain only controlled with oxycodone, refilled and advised this may be last refill.  Should try to taper and not take as frequently.  Should continue advil for now. - keep follow up with pulmonology - keep follow up with CTS, sutures removed today in clinic without complication/tolerated well  Address at next visit: - ?need for follow up ECHO - quantiferon gold - pain level - cough  Perry MountACOSTA,Sharon Marhefka ROCIO, MD

## 2014-03-04 ENCOUNTER — Telehealth: Payer: Self-pay | Admitting: Internal Medicine

## 2014-03-04 ENCOUNTER — Encounter: Payer: Self-pay | Admitting: Cardiothoracic Surgery

## 2014-03-04 ENCOUNTER — Ambulatory Visit (INDEPENDENT_AMBULATORY_CARE_PROVIDER_SITE_OTHER): Payer: Self-pay | Admitting: Cardiothoracic Surgery

## 2014-03-04 VITALS — BP 134/89 | HR 87 | Ht 63.0 in | Wt 152.0 lb

## 2014-03-04 DIAGNOSIS — D861 Sarcoidosis of lymph nodes: Secondary | ICD-10-CM

## 2014-03-04 DIAGNOSIS — I319 Disease of pericardium, unspecified: Secondary | ICD-10-CM

## 2014-03-04 NOTE — Progress Notes (Signed)
PCP is Shirlee LatchBacigalupo, Angela, MD Referring Provider is Kalman Shanamaswamy, Murali, MD  Chief Complaint  Patient presents with  . F/U THORACIC    MEDIC PERICIARDIAL WINDOW    HPI: Patient returns for routine followup after subxiphoid pericardial window for acute viral pericarditis and combined left Chamberlain procedure for biopsy of left a-P. window lymph node. Pathology on pericardium was consistent with viral inflammation. 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 No significant pulmonary involvement  Past Medical History  Diagnosis Date  . Sinus tachycardia     Past Surgical History  Procedure Laterality Date  . Subxyphoid pericardial window N/A 02/12/2014    Procedure: SUBXYPHOID PERICARDIAL WINDOW;  Surgeon: Kerin PernaPeter Van Trigt, MD;  [facility];  Service: Thoracic;  Laterality: N/A;  . Thoracotomy Left 02/12/2014    Procedure: MINI/LIMITED THORACOTOMY, Biopsy of Medialstinal lymph nodes;  Surgeon: Kerin PernaPeter Van Trigt, MD;  [facility];  Service: Thoracic;  Laterality: Left;    No family history on file.  Social History History  Substance Use Topics  . Smoking status: Never Smoker   . Smokeless tobacco: Not on file  . Alcohol Use: No    Current Outpatient Prescriptions  Medication Sig Dispense Refill  . Ascorbic Acid (VITAMIN C PO) Take 1 tablet by mouth daily.       . B Complex Vitamins (VITAMIN B COMPLEX PO) Take 1 tablet by mouth daily.       Marland Kitchen. ibuprofen (ADVIL,MOTRIN) 600 MG tablet Take 1 tablet (600 mg total) by mouth 3 (three) times daily. For 5  days then stop.  30 tablet  0  . metoprolol succinate (TOPROL-XL) 25 MG 24 hr tablet Take 1 tablet (25 mg total) by mouth daily.  30 tablet  1  . colchicine 0.6 MG tablet Take 1 tablet (0.6 mg total) by mouth 2 (two) times daily. For 5 days thenn stop.  10 tablet  0  . oxyCODONE (OXY IR/ROXICODONE) 5 MG immediate release tablet Take 1-2 tablets (5-10 mg total) by mouth every 4 (four) hours as needed  for severe pain.  30 tablet  0   No current facility-administered medications for this visit.    Allergies  Allergen Reactions  . Sulfa Antibiotics Itching and Rash    Review of Systems patient still having some postoperative pain-not taking narcotic. Difficulty breathing due to pain at times. Surgical incisions healing well.  BP 134/89  Pulse 87  Ht 5\' 3"  (1.6 m)  Wt 152 lb (68.947 kg)  BMI 26.93 kg/m2  SpO2 97%  LMP 02/07/2014 Physical Exam Alert and comfortable Lungs clear Heart rhythm regular without gallop or rub Subxiphoid incision and left anterior chest incision healing well No pedal edema  Diagnostic Tests: Pericardial cultures negative Chest x-ray not performed today, last chest x-ray performed is reviewed  Impression: Doing well following subxiphoid pericardial and no and left anterior thoracotomy Chamberlain procedure for biopsy of mediastinal nodes  Plan: Patient can resume driving and go back to work in 5 days. Return to work documentation provided. The patient will stop her metoprolol when the discharge prescription runs out.  She'll return for followup in approximately 8 weeks with chest x-ray. It does not appear that she will need to start on prednisone for sarcoidosis.

## 2014-03-04 NOTE — Telephone Encounter (Signed)
lmomtcb for pt 

## 2014-03-05 NOTE — Telephone Encounter (Signed)
Called and spoke to pt. Pt stated she was suppose to call regarding prednisone but after seeing Dr. Donata ClayVan-Trigt on 10/28 he advised her not to start prednisone.    Patient Instructions 02/20/14 with MR     Please do quantiferon gold test  I am going to run your story with some national experts about treatment decisions  Call back in 1 week to check with me regarding prednisone decision  REturn to see me in 3 months or sooner if needed   Will forward to MR to make aware.

## 2014-03-08 NOTE — Telephone Encounter (Signed)
Please let her know a definite rec on prednisone or not has been difficult to find out with my conversation with others who have lot of experience. So, ok to hold off. She can come back and fu with me in 3 months  Dr. Tashari Schoenfelder, M.D., [facility]C.[facility] Pulmonary and Critical Care Medicine Staff Physician  System [place] [facility] Pager: [phone], If no answer or between  15:00h - 7:00h: call [phone]  03/08/2014 8:08 PM

## 2014-03-09 NOTE — Telephone Encounter (Signed)
Spoke with pt, she is aware of MR's recs.  Recall already placed for mid-Jamn 2016.  Nothing further needed.

## 2014-03-10 ENCOUNTER — Telehealth: Payer: Self-pay | Admitting: Emergency Medicine

## 2014-03-10 MED ORDER — HYDROCOD POLST-CHLORPHEN POLST 10-8 MG/5ML PO LQCR: 5.0000 mL | Freq: Two times a day (BID) | ORAL | Status: DC | PRN

## 2014-03-10 NOTE — Telephone Encounter (Signed)
Called to inform pt of the results. Pt began to express that her cough has not resolved since last OV with MR (02/20/14). Pt c/o dry cough when talking,  increase in SOB with cough and chest tightness that is resolving since 03/08/2014. Pt denies f/c/s. Pt is taking anything OTC. Pt requesting recs for cough. Pt aware MR is out of office and is requesting recs today. Will send to doc of day.  SN please advise.   Allergies  Allergen Reactions  . Sulfa Antibiotics Itching and Rash   Current Outpatient Prescriptions on File Prior to Visit  Medication Sig Dispense Refill  . Ascorbic Acid (VITAMIN C PO) Take 1 tablet by mouth daily.     . B Complex Vitamins (VITAMIN B COMPLEX PO) Take 1 tablet by mouth daily.     . colchicine 0.6 MG tablet Take 1 tablet (0.6 mg total) by mouth 2 (two) times daily. For 5 days thenn stop. 10 tablet 0  . ibuprofen (ADVIL,MOTRIN) 600 MG tablet Take 1 tablet (600 mg total) by mouth 3 (three) times daily. For 5  days then stop. 30 tablet 0  . metoprolol succinate (TOPROL-XL) 25 MG 24 hr tablet Take 1 tablet (25 mg total) by mouth daily. 30 tablet 1  . oxyCODONE (OXY IR/ROXICODONE) 5 MG immediate release tablet Take 1-2 tablets (5-10 mg total) by mouth every 4 (four) hours as needed for severe pain. 30 tablet 0   No current facility-administered medications on file prior to visit.

## 2014-03-10 NOTE — Telephone Encounter (Signed)
Per SN: Okay to refill tussionex #4 OZ 5 ml's PO q12hrs prn for cough and pt needs to f/u with MR  Pt is aware of recs. RX printed for SN to sign. Pt will pick up in AM. She wants to hold off on appt for now. Nothing further needed

## 2014-03-10 NOTE — Progress Notes (Signed)
Quick Note:  Called and spoke to pt. Informed pt of the results per MR. Pt verbalized understanding. Pt also c/o prod cough, SOB and resolving CP--(see phone note from 11/3) ______

## 2014-03-11 LAB — FUNGUS CULTURE W SMEAR
FUNGAL SMEAR: NONE SEEN
FUNGAL SMEAR: NONE SEEN

## 2014-03-27 LAB — AFB CULTURE WITH SMEAR (NOT AT ARMC)
ACID FAST SMEAR: NONE SEEN
Acid Fast Smear: NONE SEEN

## 2014-04-08 ENCOUNTER — Encounter: Payer: Self-pay | Admitting: Family Medicine

## 2014-05-08 DIAGNOSIS — I3139 Other pericardial effusion (noninflammatory): Secondary | ICD-10-CM

## 2014-05-08 HISTORY — DX: Other pericardial effusion (noninflammatory): I31.39

## 2014-05-12 ENCOUNTER — Other Ambulatory Visit: Payer: Self-pay | Admitting: Cardiothoracic Surgery

## 2014-05-12 DIAGNOSIS — Z9889 Other specified postprocedural states: Secondary | ICD-10-CM

## 2014-05-13 ENCOUNTER — Ambulatory Visit
Admission: RE | Admit: 2014-05-13 | Discharge: 2014-05-13 | Disposition: A | Discharge status: Home / Self Care | Payer: BC Managed Care – PPO | Source: Ambulatory Visit | Attending: Cardiothoracic Surgery | Admitting: Cardiothoracic Surgery

## 2014-05-13 ENCOUNTER — Ambulatory Visit (INDEPENDENT_AMBULATORY_CARE_PROVIDER_SITE_OTHER): Payer: Self-pay | Admitting: Cardiothoracic Surgery

## 2014-05-13 ENCOUNTER — Encounter: Payer: Self-pay | Admitting: Cardiothoracic Surgery

## 2014-05-13 VITALS — BP 140/84 | HR 87 | Resp 20 | Ht 63.0 in | Wt 153.0 lb

## 2014-05-13 DIAGNOSIS — Z9889 Other specified postprocedural states: Secondary | ICD-10-CM

## 2014-05-13 DIAGNOSIS — I319 Disease of pericardium, unspecified: Secondary | ICD-10-CM

## 2014-05-13 DIAGNOSIS — D861 Sarcoidosis of lymph nodes: Secondary | ICD-10-CM

## 2014-05-13 NOTE — Progress Notes (Signed)
PCP is Sharon Case, Angela, MD Referring Provider is Kalman Shanamaswamy, Murali, MD  Chief Complaint  Patient presents with  . Routine Post Op    2 month f/u with CXR    HPI: Final office visit 2 months after left Chamberlain procedure and biopsy of AP window lymph nodes showing sarcoidosis with combined subxiphoid pericardial window for probable viral pericarditis. Patient doing well back working full-time. No significant surgical pain. Occasional dry cough. No dyspnea.  Past Medical History  Diagnosis Date  . Sinus tachycardia     Past Surgical History  Procedure Laterality Date  . Subxyphoid pericardial window N/A 02/12/2014    Procedure: SUBXYPHOID PERICARDIAL WINDOW;  Surgeon: Kerin PernaPeter Van Trigt, MD;  [facility];  Service: Thoracic;  Laterality: N/A;  . Thoracotomy Left 02/12/2014    Procedure: MINI/LIMITED THORACOTOMY, Biopsy of Medialstinal lymph nodes;  Surgeon: Kerin PernaPeter Van Trigt, MD;  [facility];  Service: Thoracic;  Laterality: Left;    No family history on file.  Social History History  Substance Use Topics  . Smoking status: Never Smoker   . Smokeless tobacco: Not on file  . Alcohol Use: No    Current Outpatient Prescriptions  Medication Sig Dispense Refill  . Ascorbic Acid (VITAMIN C PO) Take 1 tablet by mouth daily.     . B Complex Vitamins (VITAMIN B COMPLEX PO) Take 1 tablet by mouth daily.      No current facility-administered medications for this visit.    Allergies  Allergen Reactions  . Sulfa Antibiotics Itching and Rash    Review of Systemsno fever, weight stable, energy normal, exercise tolerance normal  BP 140/84 mmHg  Pulse 87  Resp 20  Ht 5\' 3"  (1.6 m)  Wt 153 lb (69.4 kg)  BMI 27.11 kg/m2  SpO2 98% Physical Exam Alert and pleasant Lungs clear No palpable nodes in the neck Heart rhythm regular without murmur or rub Surgical sites well-healed, slight keloid formation over left anterior chest  Diagnostic Tests: Chest x-ray  clear  Impression: Patient has recovered from surgery-left Chamberlain procedure for biopsy of AP window lymph node and subxiphoid pericardial window  No evidence of symptomatic sarcoidosis No evidence of recurrent pericarditis or pericardial effusion  Plan:return as needed

## 2014-06-04 ENCOUNTER — Encounter: Payer: Self-pay | Admitting: Internal Medicine

## 2014-06-04 ENCOUNTER — Ambulatory Visit (INDEPENDENT_AMBULATORY_CARE_PROVIDER_SITE_OTHER): Payer: BC Managed Care – PPO | Admitting: Internal Medicine

## 2014-06-04 VITALS — BP 162/90 | HR 81 | Ht 63.0 in | Wt 160.0 lb

## 2014-06-04 DIAGNOSIS — R599 Enlarged lymph nodes, unspecified: Secondary | ICD-10-CM

## 2014-06-04 DIAGNOSIS — I3139 Other pericardial effusion (noninflammatory): Secondary | ICD-10-CM

## 2014-06-04 DIAGNOSIS — I313 Pericardial effusion (noninflammatory): Secondary | ICD-10-CM

## 2014-06-04 DIAGNOSIS — R590 Localized enlarged lymph nodes: Secondary | ICD-10-CM

## 2014-06-04 DIAGNOSIS — I319 Disease of pericardium, unspecified: Secondary | ICD-10-CM

## 2014-06-04 NOTE — Progress Notes (Signed)
Subjective:    Patient ID: Sharon Case, female    DOB: Jul 01, 1960, 54 y.o.   MRN: [id number]  HPI    OV 02/20/2014  Chief Complaint  Patient presents with  . HFU    Pt denies any SOB, chest pain or tightness. She does states she has a dry cough.    54 year old AA female, non smoker, from the [facility] without travel hx and TB exposure hx. Admitted with 6 week atypical chest pain and dyspnea that progressed over 2 weeks. CTA of the chest was completed to rule out PE and was negative but did demonstrate a moderate pericardial effusion, mediastinal LAN, lower atx, R 96m nodule.  ECHO assessed and pericardial effusion was free flowing without tamponade physiology. On 02/12/14:  Mediastinal biopsy showed non-caseating granuloma. SHe had pericardia biopsythat was non-diagnostic with inflamed fibrous material. ESR was 58, ACE normal. Autoimmune negative  Today 02/20/2014 - presents for followup. STill with some mild cough. Dyspnea improved but not resolved. Overall well. 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 Know it is not cancer andis idiopathic. REst she is not certain. Her  Female friend with her has more insight into her disease.    ACE test normal Quantiferon gold normal   OV 06/04/2014  Chief Complaint  Patient presents with  . Follow-up    Pt stated her breathing has improved since last OV. Pt stated she does not become as SOB, improved dry cough. Pt denies CP/tightness.    Follow-up 54year old female withidiopathic pericardial effusion and mediastinal lymphadenopathy status post pericardial biopsy that was nondiagnostic and mediastinal biopsy that showed noncaseating granuloma. ACE level normal. Quantiferon GOld normal. 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 It has been several months since October 2015 when I last saw her. In the interim she's been fairly asymptomatic without any shortness of breath or cough or wheezing or chest  tightness or dizziness or chest pain. The last few days she's had some dry cough that is brought on by excessive talking and relieved by rest but otherwise she is fine. This cough is only mild. This no associated fever.  She has opted for an expectant follow-up without prednisone given the fact me to several sarcoid is only stage I and pericardial inflammation was resolved by surgery and there is no evidence of extra pulmonary sarcoidosis  Review of Systems  Constitutional: Negative for fever and unexpected weight change.  HENT: Negative for congestion, dental problem, ear pain, nosebleeds, postnasal drip, rhinorrhea, sinus pressure, sneezing, sore throat and trouble swallowing.   Eyes: Negative for redness and itching.  Respiratory: Positive for cough. Negative for chest tightness, shortness of breath and wheezing.   Cardiovascular: Negative for palpitations and leg swelling.  Gastrointestinal: Negative for nausea and vomiting.  Genitourinary: Negative for dysuria.  Musculoskeletal: Negative for joint swelling.  Skin: Negative for rash.  Neurological: Negative for headaches.  Hematological: Does not bruise/bleed easily.  Psychiatric/Behavioral: Negative for dysphoric mood. The patient is not nervous/anxious.     Current outpatient prescriptions:  .  Ascorbic Acid (VITAMIN C PO), Take 1 tablet by mouth daily. , Disp: , Rfl:  .  B Complex Vitamins (VITAMIN B COMPLEX PO), Take 1 tablet by mouth daily. , Disp: , Rfl:       Objective:   Physical Exam  Constitutional: She is oriented to person, place, and time. She appears well-developed and well-nourished. No distress.  HENT:  Head: Normocephalic and atraumatic.  Right Ear:  External ear normal.  Left Ear: External ear normal.  Mouth/Throat: Oropharynx is clear and moist. No oropharyngeal exudate.  Eyes: Conjunctivae and EOM are normal. Pupils are equal, round, and reactive to light. Right eye exhibits no discharge. Left eye exhibits no  discharge. No scleral icterus.  Neck: Normal range of motion. Neck supple. No JVD present. No tracheal deviation present. No thyromegaly present.  Cardiovascular: Normal rate, regular rhythm, normal heart sounds and intact distal pulses.  Exam reveals no gallop and no friction rub.   No murmur heard. Pulmonary/Chest: Effort normal and breath sounds normal. No respiratory distress. She has no wheezes. She has no rales. She exhibits no tenderness.  Abdominal: Soft. Bowel sounds are normal. She exhibits no distension and no mass. There is no tenderness. There is no rebound and no guarding.  Musculoskeletal: Normal range of motion. She exhibits no edema or tenderness.  Lymphadenopathy:    She has no cervical adenopathy.  Neurological: She is alert and oriented to person, place, and time. She has normal reflexes. No cranial nerve deficit. She exhibits normal muscle tone. Coordination normal.  Skin: Skin is warm and dry. No rash noted. She is not diaphoretic. No erythema. No pallor.  Psychiatric: She has a normal mood and affect. Her behavior is normal. Judgment and thought content normal.  Vitals reviewed.   Filed Vitals:   06/04/14 1535  BP: 162/90  Pulse: 81  Height: _0  (1.6 m)  Weight: 160 lb (72.576 kg)  SpO2: 95%         Assessment & Plan:     ICD-9-CM ICD-10-CM   1. Mediastinal adenopathy 785.6 R59.9 CT Chest W Contrast  2. Pericardial effusion 423.9 I31.9 CT Chest W Contrast   Despite angiotensin-converting enzyme being normal the diagnosis for these problem is likely sarcoidosis given age and ethnicity. Currently she is fairly asymptomatic and she prefers expectant course of management anyways. At this point we'll continue to monitor her. Next visit will be end of August 2016 at which time we'll do a one year follow-up CT scan of the chest. In between if she develops symptoms or gets worse she knows to report back   Dr. Brand Males, M.D., [facility].C.P Pulmonary and Critical  Care Medicine Staff Physician [facility] Pager: [phone], If no answer or between  15:00h - 7:00h: call [phone]  06/04/2014 4:00 PM

## 2014-06-04 NOTE — Patient Instructions (Signed)
ICD-9-CM ICD-10-CM   1. Mediastinal adenopathy 785.6 R59.9   2. Pericardial effusion 423.9 I31.9     Do ct chest with contrast august/sept 2016 REturn to see me aug/sept 2016 after ct chest Report to us or ER Sooner if there are problems

## 2014-07-13 ENCOUNTER — Encounter: Payer: Self-pay | Admitting: Family Medicine

## 2014-07-17 ENCOUNTER — Encounter: Payer: Self-pay | Admitting: Family Medicine

## 2014-07-17 ENCOUNTER — Ambulatory Visit (INDEPENDENT_AMBULATORY_CARE_PROVIDER_SITE_OTHER): Payer: BC Managed Care – PPO | Admitting: Family Medicine

## 2014-07-17 VITALS — BP 148/86 | HR 97 | Temp 98.2°F | Ht 63.0 in | Wt 157.2 lb

## 2014-07-17 DIAGNOSIS — R599 Enlarged lymph nodes, unspecified: Secondary | ICD-10-CM

## 2014-07-17 DIAGNOSIS — R590 Localized enlarged lymph nodes: Secondary | ICD-10-CM

## 2014-07-17 DIAGNOSIS — J069 Acute upper respiratory infection, unspecified: Secondary | ICD-10-CM

## 2014-07-17 MED ORDER — FLUTICASONE PROPIONATE 50 MCG/ACT NA SUSP: 2.0000 | Freq: Every day | NASAL | Status: AC

## 2014-07-17 MED ORDER — HYDROCOD POLST-CHLORPHEN POLST 10-8 MG/5ML PO LQCR: 5.0000 mL | Freq: Two times a day (BID) | ORAL | Status: DC | PRN

## 2014-07-17 NOTE — Progress Notes (Signed)
   Subjective:    Patient ID: Sharon Case, female    DOB: [birth year], 54 y.o.   MRN: [id number]  HPI 54 year old female with a history of pericardial effusion and now with diagnosis of sarcoidosis presents for same day appointment with complaints of URI symptoms.  1) URI  Patient reports that she has had sore throat, nasal congestion and cough since Tuesday.  Patient has the cough is mildly productive.  He has had a few episodes where was slightly blood-tinged.  She denies any fever, shortness of breath, rash, chest pain.  She's been taking over-the-counter Alka-Seltzer plus as well as an old prescription cough syrup with improvement in her cough but not the remainder of her symptoms.  She notes positive sick contacts at work.  No other complaints at this time.  PMH reviewed.   Social Hx - Nonsmoker.  Review of Systems Per HPI.    Objective:   Physical Exam Filed Vitals:   07/17/14 1053  BP: 148/86  Pulse: 97  Temp: 98.2 F (36.8 C)   Exam: General: well appearing female in NAD.  HEENT: NCAT. Normal TMs bilaterally. Oropharynx clear.  Neck: Supple Cardiovascular: RRR. No murmurs, rubs, or gallops. Respiratory: Mild left sided wheezing.  Abdomen: soft, nontender, nondistended.    Assessment & Plan:  See Problem List

## 2014-07-17 NOTE — Patient Instructions (Signed)
It was nice to see you today.  You have a URI.  If you worsen please let me know.  Take care  Dr. Adriana Simasook

## 2014-07-17 NOTE — Assessment & Plan Note (Signed)
Patient with signs and symptoms of upper respiratory infection. Given history of sarcoidosis and mild adventitious sounds on lung exam, I discussed obtaining a chest x-ray with the patient. After discussion she elected to not proceed with chest x-ray at this time. Will treat symptomatically with Flonase and Tussionex. Return precautions given. Patient to call or return if she develops fever, shortness of breath, or worsening symptoms.

## 2014-08-06 ENCOUNTER — Encounter: Payer: Self-pay | Admitting: Family Medicine

## 2014-08-06 ENCOUNTER — Ambulatory Visit (INDEPENDENT_AMBULATORY_CARE_PROVIDER_SITE_OTHER): Payer: BC Managed Care – PPO | Admitting: Family Medicine

## 2014-08-06 VITALS — BP 162/117 | HR 94 | Temp 98.0°F | Ht 63.0 in | Wt 162.5 lb

## 2014-08-06 DIAGNOSIS — R042 Hemoptysis: Secondary | ICD-10-CM | POA: Diagnosis not present

## 2014-08-06 DIAGNOSIS — IMO0001 Reserved for inherently not codable concepts without codable children: Secondary | ICD-10-CM

## 2014-08-06 DIAGNOSIS — R030 Elevated blood-pressure reading, without diagnosis of hypertension: Secondary | ICD-10-CM

## 2014-08-06 NOTE — Patient Instructions (Signed)
Front desk: Please schedule a nurse visit early next week for blood pressure check. Thanks! --CMS  Thank you for coming in, today!  I'm not sure what's caused the blood clot in your sputum. I don't see anything that looks like a definite nose bleed. Your blood pressure IS high, today, but I think it's okay to watch it to see if you need to be on medicine.  Come back early next week for a nurse visit to check your blood pressure. If it's still high at that time, they'll set you up a visit to see one of the doctors.  Otherwise, come back to see Dr. Beryle FlockBacigalupo as you need. I will let her know what we talked about, today.  If you have ANY of the following, come back here or go to the emergency room: - persistent chest pain, especially if it's worse with activity / exertion or associated with shortness of breath - frank nose bleeds or spitting / coughing up more blood - severe headaches that don't go away - numbness, weakness, trouble speaking, confusion, or fainting - these symptoms could be the sign of heart trouble or stroke and you would need to be checked out  Please feel free to call with any questions or concerns at any time, at [phone]. --Dr. Casper HarrisonStreet

## 2014-08-06 NOTE — Progress Notes (Signed)
   Subjective:    Patient ID: Sharon MantleShelby Case, female    DOB: 04/19/1961, 54 y.o.   MRN: [id number]  HPI: Pt presents to clinic for SDA for coughing up blood yesterday; it was described as about a half-teaspoon with the appearance of a clot, dark. She is not SOB and does not have other productive cough. She was seen earlier this month for a URI, which has cleared up; she does have some occasional cough but overall it is better. She has had no nosebleeds, taste of blood in her mouth, or sinus drainage. She has not taken any medications other than Tussionex, which she last took a few weeks ago.  Of note, pt was diagnosed with sarcoidosis about 6 months ago. She has seen pulmonology and is due to see them again in September. She has not been treated with steroids per her choice.  Review of Systems: As above. She denies fevers, congestion, runny nose. She has had no nose bleeds. She denies weight loss, night sweats. She has no recent travel or sick contacts. She has no LE swelling, SOB, or chest pain.     Objective:   Physical Exam BP 162/117 mmHg  Pulse 94  Temp(Src) 98 F (36.7 C) (Oral)  Ht 5\' 3"  (1.6 m)  Wt 162 lb 8 oz (73.71 kg)  BMI 28.79 kg/m2  LMP 06/26/2014 Manual recheck BP 162/98 Gen: well-appearing adult female in NAD HEENT: [place]/AT, EOMI, PERRLA, TM's clear bilaterally  Nasal mucosae mildly red / edematous but no frank lesions noted  Posterior oropharynx clear  No blood draining in back of throat, no blood in nasal vault Cardio: RRR, no murmur appreciated Pulm: CTAB, no wheezes, normal WOB, good air movement throughout Ext: warm, well-perfused, no LE edema, redness, or tenderness Neuro: alert, oriented, no gross focal deficit     Assessment & Plan:  54yo female with one episode of hemoptysis but no clear etiology; potentially related to sarcoidosis - incidentally noted to have HTN to the 160's systolic, though generally asymptomatic - exam unremarkable without obvious  source of bleeding noted - no history suggestive of TB or similar etiology - no history or exam findings suggestive of PE  Plan: - monitor for further episodes, for now; recommended calling pulmonologist to f/u sooner than September - could consider ENT f/u if persistent or worsens, or if nosebleed felt to be etiologic of hemoptysis - recommended RN check for BP next week, then formal F/U with PCP Dr. Beryle FlockBacigalupo if still elevated at that time - pt declined anti-HTN medicine at this time (offered Norvasc, empirically)  Note FYI to Dr. Beryle FlockBacigalupo.  Bobbye Mortonhristopher M Iyona Pehrson, MD PGY-3, [facility] 08/06/2014, 4:09 PM

## 2014-08-10 ENCOUNTER — Ambulatory Visit (INDEPENDENT_AMBULATORY_CARE_PROVIDER_SITE_OTHER): Payer: BC Managed Care – PPO | Admitting: *Deleted

## 2014-08-10 VITALS — BP 140/82 | HR 89

## 2014-08-10 DIAGNOSIS — Z136 Encounter for screening for cardiovascular disorders: Secondary | ICD-10-CM

## 2014-08-10 DIAGNOSIS — Z0130 Encounter for examination of blood pressure without abnormal findings: Secondary | ICD-10-CM

## 2014-08-10 NOTE — Progress Notes (Signed)
   Pt in nurse clinic for blood pressure check.  BP 140/82 manually, heart rate 89.  Pt denies any symptoms today.  Will forward to PCP.  Clovis PuMartin, Tamika L, RN

## 2014-08-11 ENCOUNTER — Telehealth: Payer: Self-pay | Admitting: Family Medicine

## 2014-08-11 NOTE — Telephone Encounter (Signed)
Caleld and spoke to patient regarding BP at recent RN visit.  Discussed that sBP remains >140.  Would recommend treatment.  Patient states that she would like to discuss further but she is currently at work.  She will call the clinic when she can talk further.  Please let me know when she calls back  Erasmo DownerAngela M Tarvares Lant, MD, MPH PGY-1,  [facility] 08/11/2014 11:13 AM

## 2014-08-11 NOTE — Telephone Encounter (Signed)
Patient called back regarding BP.  She states that she has been working on diet and exercise changes recently.  She believes this is why her BP has improved from 160s systolic to 140 sBP.    She would not like to start an anti-hypertensive currently.  She agreed to f/u with me in clinic on 5/2.  Appt scheduled for 8:30am.  Patient aware of appt time.   If BP still elevated at that time, will need to reconsider anti-hypertensive.  Erasmo DownerAngela M Madine Sarr, MD, MPH PGY-1,  [facility] 08/11/2014 1:59 PM

## 2014-09-07 ENCOUNTER — Encounter: Payer: Self-pay | Admitting: Family Medicine

## 2014-09-07 ENCOUNTER — Ambulatory Visit (INDEPENDENT_AMBULATORY_CARE_PROVIDER_SITE_OTHER): Payer: BC Managed Care – PPO | Admitting: Family Medicine

## 2014-09-07 VITALS — BP 120/82 | HR 80 | Ht 63.0 in | Wt 157.0 lb

## 2014-09-07 DIAGNOSIS — IMO0001 Reserved for inherently not codable concepts without codable children: Secondary | ICD-10-CM | POA: Insufficient documentation

## 2014-09-07 DIAGNOSIS — R030 Elevated blood-pressure reading, without diagnosis of hypertension: Secondary | ICD-10-CM | POA: Diagnosis not present

## 2014-09-07 NOTE — Patient Instructions (Signed)
Nice to see you again today.  Continue diet and exercise. Come back to see me in October for labs and Blood Pressure follow-up.  Things to do to keep yourself healthy  - Exercise at least 30-45 minutes a day, 3-4 days a week.  - Eat a low-fat diet with lots of fruits and vegetables, up to 7-9 servings per day.  - Seatbelts can save your life. Wear them always.  - Smoke detectors on every level of your home, check batteries every year.  - Eye Doctor - have an eye exam every 1-2 years  - Safe sex - if you may be exposed to STDs, use a condom.  - Alcohol -  If you drink, do it moderately, less than 2 drinks per day.  - Health Care Power of Attorney. Choose someone to speak for you if you are not able.  - Depression is common in our stressful world.If you're feeling down or losing interest in things you normally enjoy, please come in for a visit.  - Violence - If anyone is threatening or hurting you, please call immediately.

## 2014-09-07 NOTE — Progress Notes (Signed)
   Subjective:   Sharon Case is a 54 y.o. 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  Patient recently seen for RN visit on 4/4 for BP check. BP 140/82 manual.  Discussed with patient at the time, who reported working on diet and exercise and not wanting to take an antihypertensive.  Exercise: walking and lifting weights every day  Monitoring BP at home, checking BID - has improved from 160s-180s systolic to 120-130 systolic over last few weeks.  Denies CP, SOB, HAs  Review of Systems:  Per HPI. All other systems reviewed and are negative.   PMH, PSH, Medications, Allergies, and FmHx reviewed and updated in EMR.  Social History: never smoker  Objective:  BP 120/82 mmHg  Pulse 80  Ht 5\' 3"  (1.6 m)  Wt 157 lb (71.215 kg)  BMI 27.82 kg/m2  LMP 08/14/2014  Gen:  54 y.o. female in NAD HEENT: NCAT, MMM, EOMI, PERRL, anicteric sclerae CV: RRR, no MRG, no JVD Resp: Non-labored, CTAB, no wheezes noted Ext: WWP, no edema Neuro: Alert and oriented, speech normal      Chemistry      Component Value Date/Time   NA 142 02/17/2014 0505   K 3.5* 02/17/2014 0505   CL 101 02/17/2014 0505   CO2 29 02/17/2014 0505   BUN 9 02/17/2014 0505   CREATININE 0.57 02/17/2014 0505      Component Value Date/Time   CALCIUM 8.7 02/17/2014 0505   ALKPHOS 80 02/14/2014 0434   AST 15 02/14/2014 0434   ALT 26 02/14/2014 0434   BILITOT 0.5 02/14/2014 0434      Lab Results  Component Value Date   WBC 10.0 02/17/2014   HGB 12.5 02/17/2014   HCT 37.8 02/17/2014   MCV 86.3 02/17/2014   PLT 346 02/17/2014   Lab Results  Component Value Date   TSH 2.860 02/10/2014   Lab Results  Component Value Date   HGBA1C 5.8* 02/10/2014   Assessment:     Sharon Case is a 54 y.o. female here for BP f/u.    Plan:     See problem list for problem-specific plans.   Erasmo DownerAngela M Bacigalupo, MD PGY-1,  [facility] 09/07/2014  8:43 AM

## 2014-09-07 NOTE — Assessment & Plan Note (Signed)
Improved. Well controlled with help of diet and exercise Continue to eat well and exercise daily Lipid panel and A1c in 10/15 low risk F/u in 10/16 for CPE or if there are problems arising sooner.

## 2014-12-11 ENCOUNTER — Telehealth: Payer: Self-pay | Admitting: Internal Medicine

## 2014-12-11 NOTE — Telephone Encounter (Signed)
Will route to MR to make him aware.  

## 2014-12-14 ENCOUNTER — Inpatient Hospital Stay: Admission: RE | Admit: 2014-12-14 | Payer: BC Managed Care – PPO | Source: Ambulatory Visit

## 2016-05-08 IMAGING — CR DG CHEST 2V
2 series · 2 of 2 positions shown · non-contrast
Comparison: None.

CLINICAL DATA: Mid chest pressure today.

EXAM:
CHEST  2 VIEW

[w chest pa]
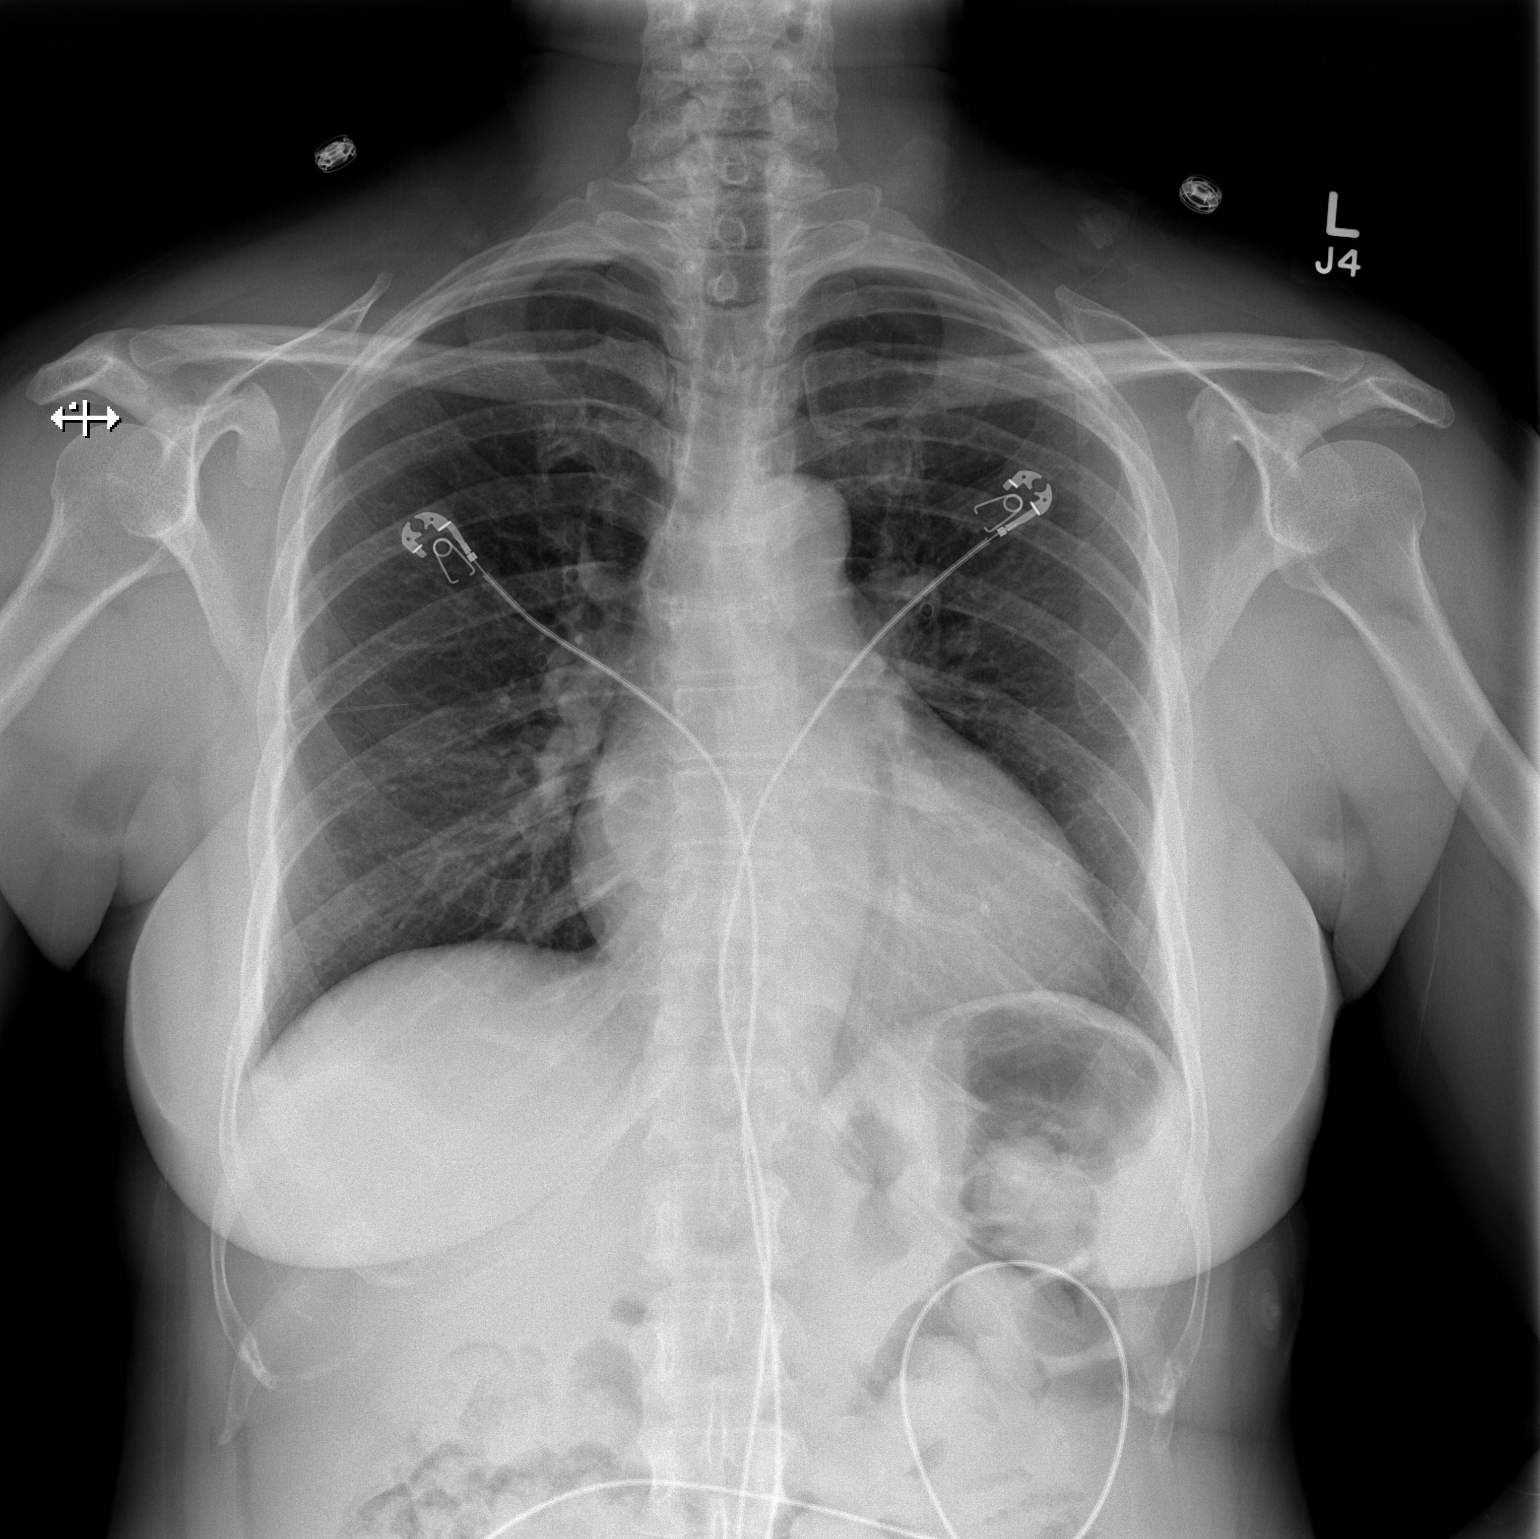

[w chest lat]
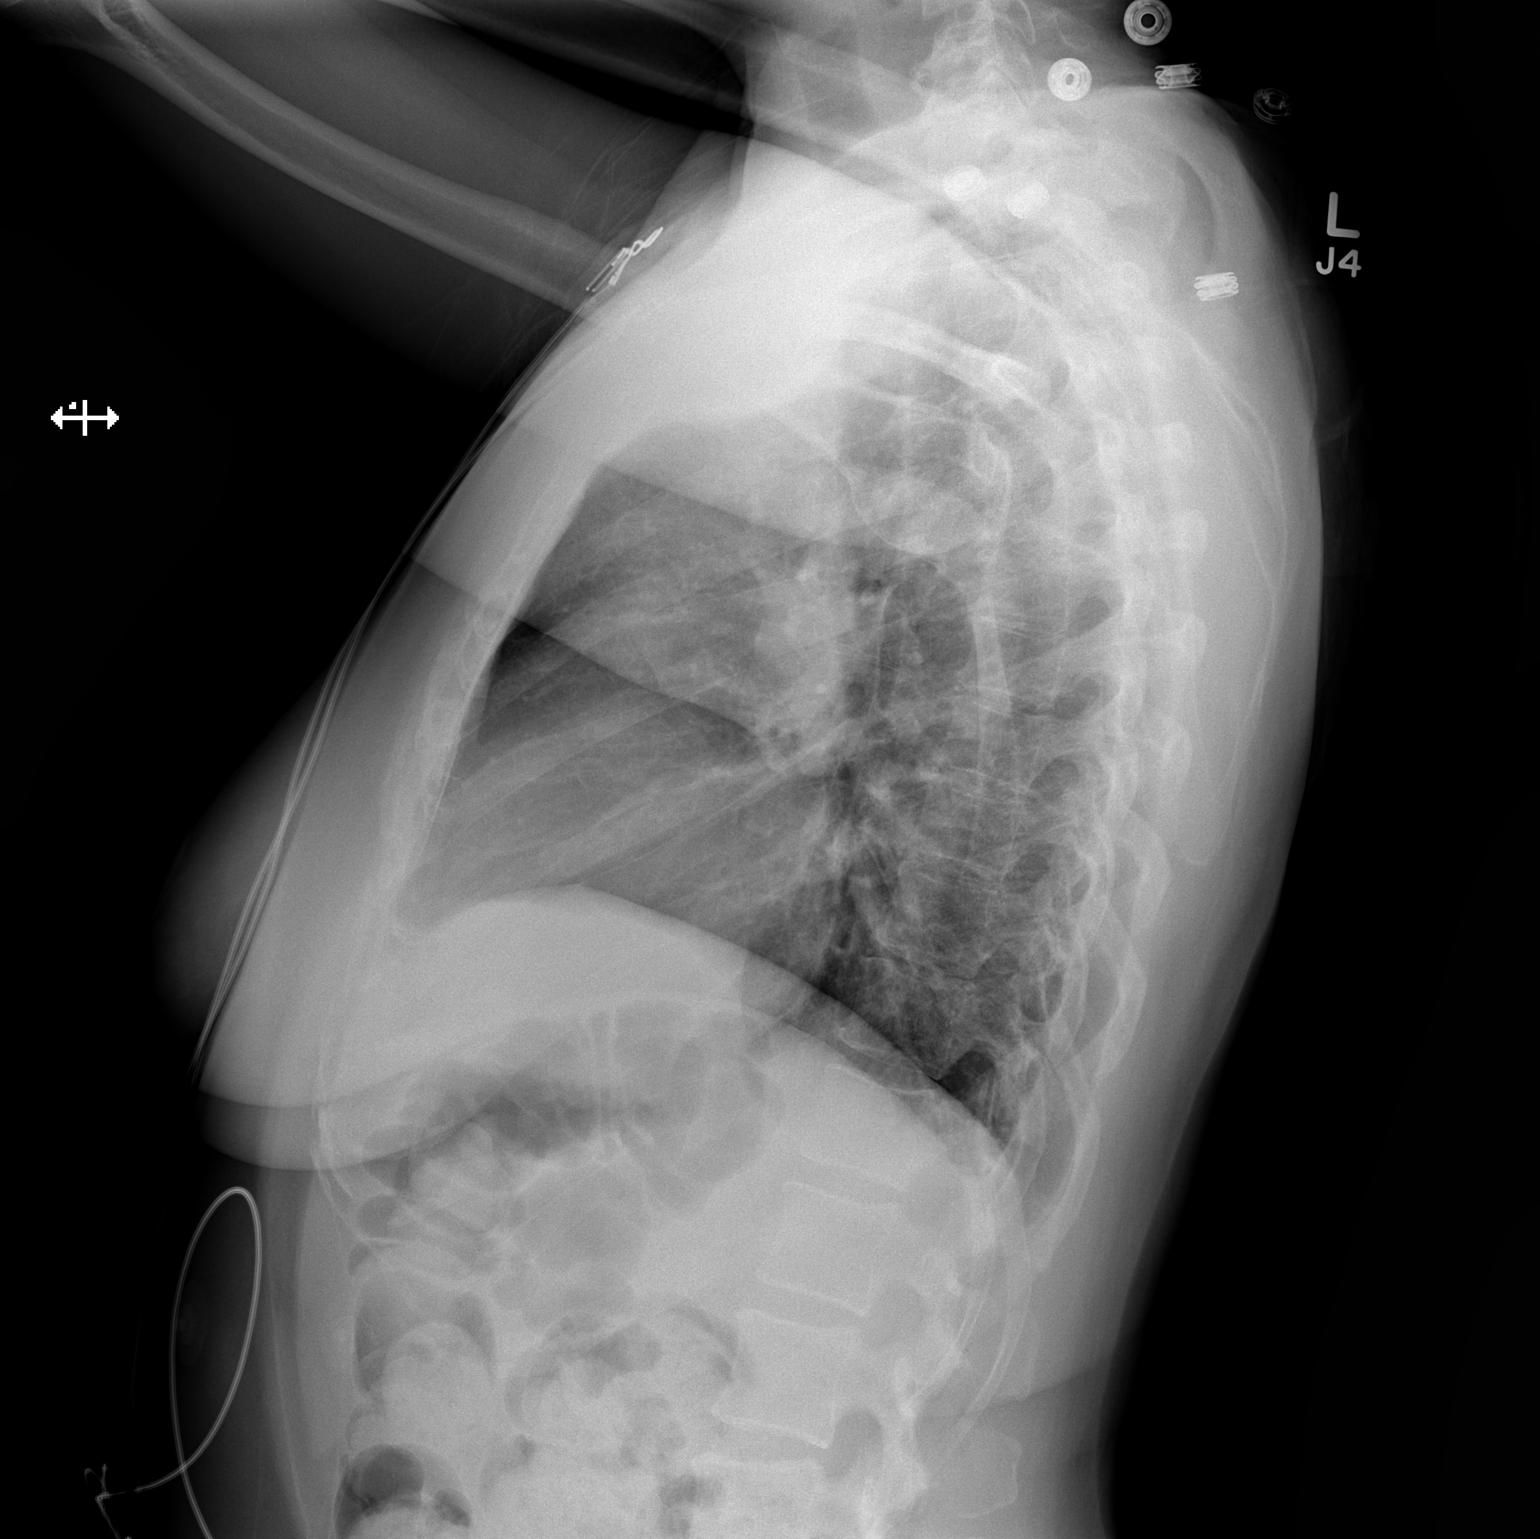

[2 of 2 positions shown; findings below may reference images not displayed]

FINDINGS: The heart size is at the upper limits of normal. This may be
exacerbated by a relatively shallow inspiratory effort. The lungs
are clear. There is no pleural effusion or pneumothorax. The osseous
structures appear normal. Telemetry leads overlie the chest.
IMPRESSION: Borderline heart size.  No acute cardiopulmonary process.

## 2019-09-03 ENCOUNTER — Emergency Department (HOSPITAL_COMMUNITY): Payer: 59

## 2019-09-03 ENCOUNTER — Encounter: Payer: Self-pay | Admitting: *Deleted

## 2019-09-03 ENCOUNTER — Emergency Department (HOSPITAL_COMMUNITY)
Admission: EM | Admit: 2019-09-03 | Discharge: 2019-09-03 | Discharge status: Against Medical Advice | Payer: 59 | Attending: Emergency Medicine | Admitting: Emergency Medicine

## 2019-09-03 DIAGNOSIS — R0602 Shortness of breath: Secondary | ICD-10-CM | POA: Insufficient documentation

## 2019-09-03 DIAGNOSIS — Z5321 Procedure and treatment not carried out due to patient leaving prior to being seen by health care provider: Secondary | ICD-10-CM | POA: Diagnosis not present

## 2019-09-03 DIAGNOSIS — R200 Anesthesia of skin: Secondary | ICD-10-CM | POA: Diagnosis not present

## 2019-09-03 LAB — CBC
HCT: 52.9 % — ABNORMAL HIGH (ref 36.0–46.0)
Hemoglobin: 16.8 g/dL — ABNORMAL HIGH (ref 12.0–15.0)
MCH: 28.8 pg (ref 26.0–34.0)
MCHC: 31.8 g/dL (ref 30.0–36.0)
MCV: 90.6 fL (ref 80.0–100.0)
Platelets: 147 10*3/uL — ABNORMAL LOW (ref 150–400)
RBC: 5.84 MIL/uL — ABNORMAL HIGH (ref 3.87–5.11)
RDW: 13.3 % (ref 11.5–15.5)
WBC: 3.9 10*3/uL — ABNORMAL LOW (ref 4.0–10.5)
nRBC: 0 % (ref 0.0–0.2)

## 2019-09-03 LAB — BASIC METABOLIC PANEL
Anion gap: 17 — ABNORMAL HIGH (ref 5–15)
BUN: 10 mg/dL (ref 6–20)
CO2: 21 mmol/L — ABNORMAL LOW (ref 22–32)
Calcium: 9 mg/dL (ref 8.9–10.3)
Chloride: 104 mmol/L (ref 98–111)
Creatinine, Ser: 0.81 mg/dL (ref 0.44–1.00)
GFR calc Af Amer: >60 mL/min (ref >60)
GFR calc non Af Amer: >60 mL/min (ref >60)
Glucose, Bld: 86 mg/dL (ref 70–99)
Potassium: 3 mmol/L — ABNORMAL LOW (ref 3.5–5.1)
Sodium: 142 mmol/L (ref 135–145)

## 2019-09-03 LAB — TROPONIN I (HIGH SENSITIVITY): Troponin I (High Sensitivity): 4 ng/L (ref <18)

## 2019-09-03 NOTE — ED Notes (Signed)
Pt did not respond to vital recheck

## 2019-09-03 NOTE — ED Triage Notes (Signed)
To ED for eval of sob and general numbness - started abruptly while sitting at her desk working this afternoon. Pt tried driving self to the ED but felt too weak so pulled over and call EMS. Pt still complains of the numbness. SOB has subsided a little.

## 2019-09-07 ENCOUNTER — Telehealth: Payer: 59 | Admitting: Physician Assistant

## 2019-09-07 ENCOUNTER — Encounter: Payer: Self-pay | Admitting: Physician Assistant

## 2019-09-07 DIAGNOSIS — Z1152 Encounter for screening for COVID-19: Secondary | ICD-10-CM

## 2019-09-07 DIAGNOSIS — J208 Acute bronchitis due to other specified organisms: Secondary | ICD-10-CM

## 2019-09-07 DIAGNOSIS — R05 Cough: Secondary | ICD-10-CM

## 2019-09-07 DIAGNOSIS — R059 Cough, unspecified: Secondary | ICD-10-CM

## 2019-09-07 MED ORDER — BENZONATATE 100 MG PO CAPS: 100.0000 mg | ORAL_CAPSULE | Freq: Three times a day (TID) | ORAL | 0 refills | Status: DC | PRN

## 2019-09-07 MED ORDER — AZITHROMYCIN 250 MG PO TABS: ORAL_TABLET | ORAL | 0 refills | Status: DC

## 2019-09-07 NOTE — Progress Notes (Signed)
We are sorry that you are not feeling well.  Here is how we plan to help!  Your current symptoms could be consistent with the coronavirus.  Many health care providers can now test patients at their office but not all are.  [place] has multiple testing sites. For information on our COVID testing locations and hours go to [email or web address]  We are enrolling you in our MyChart Home Monitoring for COVID19 . Daily you will receive a questionnaire within the MyChart website. Our COVID 19 response team will be monitoring your responses daily.  Testing Information: The COVID-19 Community Testing sites will begin testing BY APPOINTMENT ONLY.  You can schedule online at [email or web address]  If you do not have access to a smart phone or computer you may call [phone] for an appointment.   Additional testing sites in the Community:  . For [facility] Testing sites in [facility]  [email or web address]  . For Pop-up testing sites in [place]  [email or web address]  . For Testing sites with regular hours [email or web address]  . For [facility] [email or web address]  . For [facility] [email or web address]  . For [facility] testing in [place] and [employer] [email or web address]  . For [facility] testing in [facility]   [email or web address]  For  more information about community testing call [phone]   Please quarantine yourself while awaiting your test results. Please stay home for a minimum of 10 days from the first day of illness with improving symptoms and you have had 24 hours of no fever (without  the use of Tylenol (Acetaminophen) Motrin (Ibuprofen) or any fever reducing medication).  Also - Do not get tested prior to returning to work because once you have had a positive test the test can stay positive for more then a month in some cases.   You should wear a mask or cloth face covering over your nose and mouth if you must be around other people or animals, including pets (even at home). Try to stay at least 6 feet away from other people. This will protect the people around you.  Please continue good preventive care measures, including:  frequent hand-washing, avoid touching your face, cover coughs/sneezes, stay out of crowds and keep a 6 foot distance from others.  COVID-19 is a respiratory illness with symptoms that are similar to the flu. Symptoms are typically mild to moderate, but there have been cases of severe illness and death due to the virus.   The following symptoms may appear 2-14 days after exposure: . Fever . Cough . Shortness of breath or difficulty breathing . Chills . Repeated shaking with chills . Muscle pain . Headache . Sore throat . New loss of taste or smell . Fatigue . Congestion or runny nose . Nausea or vomiting . Diarrhea  Go to the nearest hospital ED for assessment if fever/cough/breathlessness are severe or illness seems like a threat to life.  It is vitally important that if you feel that you have an infection such as this virus or any other virus that you stay home and away from places where you may spread it to others.  You should avoid contact with people age 27 and older.     I have prescribed Azithromyin 250 mg: two tablets now and then one tablet daily for 4 additonal days    In addition you may use A prescription cough medication called Tessalon Perles 100mg . You may take 1-2 capsules every 8 hours as needed for your cough.  Prednisone  5 mg daily for 6 days (see taper instructions below)  Directions for 6 day taper: Day 1: 2 tablets before  breakfast, 1 after both lunch & dinner and 2 at bedtime Day 2: 1 tab before breakfast, 1 after both lunch & dinner and 2 at bedtime Day 3: 1 tab at each meal & 1 at bedtime Day 4: 1 tab at breakfast, 1 at lunch, 1 at bedtime Day 5: 1 tab at breakfast & 1 tab at bedtime Day 6: 1 tab at breakfast     You may also take acetaminophen (Tylenol) as needed for fever.  Reduce your risk of any infection by using the same precautions used for avoiding the common cold or flu:  Marland Kitchen Wash your hands often with soap and warm water for at least 20 seconds.  If soap and water are not readily available, use an alcohol-based hand sanitizer with at least 60% alcohol.  . If coughing or sneezing, cover your mouth and nose by coughing or sneezing into the elbow areas of your shirt or coat, into a tissue or into your sleeve (not your hands). . Avoid shaking hands with others and consider head nods or verbal greetings only. . Avoid touching your eyes, nose, or mouth with unwashed hands.  . Avoid close contact with people who are sick. . Avoid places or events with large numbers of people in one location, like concerts or sporting events. . Carefully consider travel plans you have or are making. . If you are planning any travel outside or inside the [place], visit the CDC's Travelers' Health webpage for the latest health notices. . If you have some symptoms but not all symptoms, continue to monitor at home and seek medical attention if your symptoms worsen. . If you are having a medical emergency, call 911.  HOME CARE . Only take medications as instructed by your medical team. . Drink plenty of fluids and get plenty of rest. . A steam or ultrasonic humidifier can help if you have congestion.   GET HELP RIGHT AWAY IF YOU HAVE EMERGENCY WARNING SIGNS** FOR COVID-19. If you or someone is showing any of these signs seek emergency medical care immediately. Call 911 or proceed to your closest emergency facility if: . You  develop worsening high fever. . Trouble breathing . Bluish lips or face . Persistent pain or pressure in the chest . New confusion . Inability to wake or stay awake . You cough up blood. . Your symptoms become more severe  **This list is not all possible symptoms. Contact your medical provider for any symptoms that are sever or concerning to you.  MAKE SURE YOU   Understand these instructions.  Will watch your condition.  Will get help right away if you are not doing well or get worse.  Your e-visit answers were reviewed by a board certified advanced clinical practitioner to complete your personal care plan.  Depending on the condition, your plan could have included both over the counter or prescription medications.  If there is a problem please reply once you have received a response from your provider.  Your safety is important to [place].  If you have drug allergies check your prescription carefully.    You can use MyChart to ask questions about today's visit, request a non-urgent call back, or ask for a work or school excuse for 24 hours related to this e-Visit. If it has been greater than 24 hours you will need to follow up with your provider,  or enter a new e-Visit to address those concerns. You will get an e-mail in the next two days asking about your experience.  I hope that your e-visit has been valuable and will speed your recovery. Thank you for using e-visits.   I spent 5-10 minutes on review and completion of this note- Lacy Duverney [facility]

## 2019-09-08 ENCOUNTER — Ambulatory Visit: Payer: BC Managed Care – PPO | Attending: Internal Medicine

## 2019-09-08 DIAGNOSIS — Z20822 Contact with and (suspected) exposure to covid-19: Secondary | ICD-10-CM

## 2019-09-09 LAB — SARS-COV-2, NAA 2 DAY TAT

## 2019-09-09 LAB — NOVEL CORONAVIRUS, NAA: SARS-CoV-2, NAA: DETECTED — AB

## 2019-09-17 ENCOUNTER — Telehealth: Payer: 59 | Admitting: Nurse Practitioner

## 2019-09-17 DIAGNOSIS — Z20822 Contact with and (suspected) exposure to covid-19: Secondary | ICD-10-CM

## 2019-09-17 NOTE — Telephone Encounter (Signed)
Patient sent a message to.connected care asking to be put of work until May 17th. Called patient to to get more information. Advised patient that she should've been out of work 10 days from onset of symptoms and since she stated on Evisit that cough was for.more than 7 days, she would return to work on 09/10/20. She then stated her cough started on 09/07/19, the day she submitted the Evisit and that her cough has not improved. Advised pt to submit another Evisit or follow up with her doctor for the persistent cough. New note provided to pt to have her out of work 10 days from May 2.

## 2019-09-17 NOTE — Progress Notes (Signed)
  Your test for COVID-19 was negative.  Please continue good preventive care measures, including:  frequent hand-washing, avoid touching your face, cover coughs/sneezes, stay out of crowds and keep a 6 foot distance from others.  If you develop fever/cough/breathlessness, please stay home for 10 days and until you have had 24 hours of no fever (without taking a fever reducer) and improving symptoms.  Go to the nearest hospital ED for assessment if fever/cough/breathlessness are severe or illness seems like a threat to life.  Your return to work note is in your my chart.  5-10 minutes spent reviewing and documenting in chart.

## 2019-10-01 ENCOUNTER — Other Ambulatory Visit: Payer: Self-pay

## 2019-10-01 ENCOUNTER — Emergency Department (HOSPITAL_COMMUNITY)
Admission: EM | Admit: 2019-10-01 | Discharge: 2019-10-01 | Discharge status: Against Medical Advice | Payer: 59 | Attending: Emergency Medicine | Admitting: Emergency Medicine

## 2019-10-01 ENCOUNTER — Encounter (HOSPITAL_COMMUNITY): Payer: Self-pay | Admitting: *Deleted

## 2019-10-01 ENCOUNTER — Emergency Department (HOSPITAL_COMMUNITY): Payer: 59

## 2019-10-01 DIAGNOSIS — Z5321 Procedure and treatment not carried out due to patient leaving prior to being seen by health care provider: Secondary | ICD-10-CM | POA: Diagnosis not present

## 2019-10-01 DIAGNOSIS — R0789 Other chest pain: Secondary | ICD-10-CM | POA: Insufficient documentation

## 2019-10-01 LAB — BASIC METABOLIC PANEL
Anion gap: 14 (ref 5–15)
BUN: 6 mg/dL (ref 6–20)
CO2: 19 mmol/L — ABNORMAL LOW (ref 22–32)
Calcium: 9.5 mg/dL (ref 8.9–10.3)
Chloride: 106 mmol/L (ref 98–111)
Creatinine, Ser: 0.7 mg/dL (ref 0.44–1.00)
GFR calc Af Amer: >60 mL/min (ref >60)
GFR calc non Af Amer: >60 mL/min (ref >60)
Glucose, Bld: 95 mg/dL (ref 70–99)
Potassium: 3.2 mmol/L — ABNORMAL LOW (ref 3.5–5.1)
Sodium: 139 mmol/L (ref 135–145)

## 2019-10-01 LAB — CBC
HCT: 45.6 % (ref 36.0–46.0)
Hemoglobin: 15.2 g/dL — ABNORMAL HIGH (ref 12.0–15.0)
MCH: 28.9 pg (ref 26.0–34.0)
MCHC: 33.3 g/dL (ref 30.0–36.0)
MCV: 86.7 fL (ref 80.0–100.0)
Platelets: 199 10*3/uL (ref 150–400)
RBC: 5.26 MIL/uL — ABNORMAL HIGH (ref 3.87–5.11)
RDW: 13.7 % (ref 11.5–15.5)
WBC: 5.4 10*3/uL (ref 4.0–10.5)
nRBC: 0 % (ref 0.0–0.2)

## 2019-10-01 LAB — TROPONIN I (HIGH SENSITIVITY)
Troponin I (High Sensitivity): <2 ng/L (ref <18)
Troponin I (High Sensitivity): 3 ng/L (ref <18)

## 2019-10-01 MED ORDER — SODIUM CHLORIDE 0.9% FLUSH: 3.0000 mL | Freq: Once | INTRAVENOUS | Status: DC

## 2019-10-01 NOTE — ED Triage Notes (Signed)
Pt was at work and started having tingling in arms, legs, and face and then started having chest tightness. Pt was diagnosed with covid on 09/09/18 and felt sob with walking since.  She has had past pericardial window. Pt received 324mg  ASA

## 2019-10-01 NOTE — ED Notes (Signed)
Patient left AMA.

## 2022-09-27 ENCOUNTER — Encounter (HOSPITAL_BASED_OUTPATIENT_CLINIC_OR_DEPARTMENT_OTHER): Payer: Self-pay | Admitting: Obstetrics and Gynecology

## 2022-10-13 ENCOUNTER — Ambulatory Visit (HOSPITAL_BASED_OUTPATIENT_CLINIC_OR_DEPARTMENT_OTHER): Admit: 2022-10-13 | Payer: 59 | Admitting: Obstetrics and Gynecology

## 2022-10-13 SURGERY — DILATATION & CURETTAGE/HYSTEROSCOPY WITH MYOSURE
Anesthesia: General
